# Patient Record
Sex: Female | Born: 1990 | Race: White | Hispanic: Yes | Marital: Married | Smoking: Never smoker
Health system: Southern US, Community
[De-identification: ages and names within clinical notes are randomized; demographics above are authoritative.]

## PROBLEM LIST (undated history)

## (undated) DIAGNOSIS — Z5189 Encounter for other specified aftercare: Secondary | ICD-10-CM

## (undated) DIAGNOSIS — Z789 Other specified health status: Secondary | ICD-10-CM

---

## 2020-01-07 ENCOUNTER — Ambulatory Visit: Payer: Medicaid Other | Attending: Internal Medicine

## 2020-01-07 DIAGNOSIS — Z23 Encounter for immunization: Secondary | ICD-10-CM

## 2020-01-07 NOTE — Progress Notes (Signed)
   Covid-19 Vaccination Clinic  Name:  Tenleigh Byer    MRN: [id number] DOB: 1990/11/21  01/07/2020  Ms. Liska was observed post Covid-19 immunization for 15 minutes without incident. She was provided with Vaccine Information Sheet and instruction to access the V-Safe system.   Ms. Buchan was instructed to call 911 with any severe reactions post vaccine: Marland Kitchen Difficulty breathing  . Swelling of face and throat  . A fast heartbeat  . A bad rash all over body  . Dizziness and weakness   Immunizations Administered    Name Date Dose VIS Date Route   Moderna COVID-19 Vaccine 01/07/2020  4:30 PM 0.5 mL 04/2019 Intramuscular   Manufacturer: Gala Murdoch   Lot: [id number]   NDC: 58527-782-42

## 2020-02-04 ENCOUNTER — Ambulatory Visit: Payer: Medicaid Other

## 2020-02-05 ENCOUNTER — Ambulatory Visit: Payer: Medicaid Other | Attending: Internal Medicine

## 2020-02-05 DIAGNOSIS — Z23 Encounter for immunization: Secondary | ICD-10-CM

## 2020-02-05 NOTE — Progress Notes (Signed)
   Covid-19 Vaccination Clinic  Name:  Amy Fleming    MRN: [id number] DOB: May 13, 1991  02/05/2020  Amy Fleming was observed post Covid-19 immunization for 30 minutes based on pre-vaccination screening without incident. She was provided with Vaccine Information Sheet and instruction to access the V-Safe system.   Amy Fleming was instructed to call 911 with any severe reactions post vaccine: Marland Kitchen Difficulty breathing  . Swelling of face and throat  . A fast heartbeat  . A bad rash all over body  . Dizziness and weakness   Immunizations Administered    Name Date Dose VIS Date Route   Moderna COVID-19 Vaccine 02/05/2020  2:37 PM 0.5 mL 04/2019 Intramuscular   Manufacturer: Moderna   Lot: [id number]   NDC: 41324-401-02

## 2021-07-14 ENCOUNTER — Encounter: Payer: Self-pay | Admitting: Emergency Medicine

## 2021-07-14 ENCOUNTER — Other Ambulatory Visit: Payer: Self-pay

## 2021-07-14 ENCOUNTER — Emergency Department: Payer: Medicaid Other

## 2021-07-14 ENCOUNTER — Emergency Department
Admission: EM | Admit: 2021-07-14 | Discharge: 2021-07-14 | Disposition: A | Discharge status: Home / Self Care | Payer: Medicaid Other | Attending: Emergency Medicine | Admitting: Emergency Medicine

## 2021-07-14 DIAGNOSIS — O209 Hemorrhage in early pregnancy, unspecified: Secondary | ICD-10-CM | POA: Diagnosis not present

## 2021-07-14 DIAGNOSIS — Z3A01 Less than 8 weeks gestation of pregnancy: Secondary | ICD-10-CM

## 2021-07-14 DIAGNOSIS — D72829 Elevated white blood cell count, unspecified: Secondary | ICD-10-CM | POA: Diagnosis not present

## 2021-07-14 DIAGNOSIS — R109 Unspecified abdominal pain: Secondary | ICD-10-CM

## 2021-07-14 DIAGNOSIS — O1490 Unspecified pre-eclampsia, unspecified trimester: Secondary | ICD-10-CM | POA: Diagnosis not present

## 2021-07-14 DIAGNOSIS — O26891 Other specified pregnancy related conditions, first trimester: Secondary | ICD-10-CM | POA: Diagnosis not present

## 2021-07-14 DIAGNOSIS — O99111 Other diseases of the blood and blood-forming organs and certain disorders involving the immune mechanism complicating pregnancy, first trimester: Secondary | ICD-10-CM | POA: Insufficient documentation

## 2021-07-14 DIAGNOSIS — R824 Acetonuria: Secondary | ICD-10-CM | POA: Diagnosis not present

## 2021-07-14 DIAGNOSIS — R1030 Lower abdominal pain, unspecified: Secondary | ICD-10-CM

## 2021-07-14 LAB — CBC WITH DIFFERENTIAL/PLATELET
Abs Immature Granulocytes: 0.06 10*3/uL (ref 0.00–0.07)
Basophils Absolute: 0 10*3/uL (ref 0.0–0.1)
Basophils Relative: 0 %
Eosinophils Absolute: 0.4 10*3/uL (ref 0.0–0.5)
Eosinophils Relative: 3 %
HCT: 39.4 % (ref 36.0–46.0)
Hemoglobin: 13.1 g/dL (ref 12.0–15.0)
Immature Granulocytes: 0 %
Lymphocytes Relative: 27 %
Lymphs Abs: 3.8 10*3/uL (ref 0.7–4.0)
MCH: 31.8 pg (ref 26.0–34.0)
MCHC: 33.2 g/dL (ref 30.0–36.0)
MCV: 95.6 fL (ref 80.0–100.0)
Monocytes Absolute: 0.7 10*3/uL (ref 0.1–1.0)
Monocytes Relative: 5 %
Neutro Abs: 9.2 10*3/uL — ABNORMAL HIGH (ref 1.7–7.7)
Neutrophils Relative %: 65 %
Platelets: 243 10*3/uL (ref 150–400)
RBC: 4.12 MIL/uL (ref 3.87–5.11)
RDW: 13.2 % (ref 11.5–15.5)
WBC: 14.2 10*3/uL — ABNORMAL HIGH (ref 4.0–10.5)
nRBC: 0 % (ref 0.0–0.2)

## 2021-07-14 LAB — URINALYSIS, ROUTINE W REFLEX MICROSCOPIC
Bilirubin Urine: NEGATIVE
Glucose, UA: NEGATIVE mg/dL
Ketones, ur: 5 mg/dL — AB
Leukocytes,Ua: NEGATIVE
Nitrite: NEGATIVE
Protein, ur: NEGATIVE mg/dL
Specific Gravity, Urine: 1.018 (ref 1.005–1.030)
pH: 5 (ref 5.0–8.0)

## 2021-07-14 LAB — WET PREP, GENITAL
Clue Cells Wet Prep HPF POC: NONE SEEN
Sperm: NONE SEEN
Trich, Wet Prep: NONE SEEN
WBC, Wet Prep HPF POC: >=10 — AB (ref <10)
Yeast Wet Prep HPF POC: NONE SEEN

## 2021-07-14 LAB — CHLAMYDIA/NGC RT PCR (ARMC ONLY)
Chlamydia Tr: NOT DETECTED
N gonorrhoeae: NOT DETECTED

## 2021-07-14 LAB — COMPREHENSIVE METABOLIC PANEL
ALT: 16 U/L (ref 0–44)
AST: 16 U/L (ref 15–41)
Albumin: 4.2 g/dL (ref 3.5–5.0)
Alkaline Phosphatase: 82 U/L (ref 38–126)
Anion gap: 7 (ref 5–15)
BUN: 10 mg/dL (ref 6–20)
CO2: 22 mmol/L (ref 22–32)
Calcium: 9.1 mg/dL (ref 8.9–10.3)
Chloride: 106 mmol/L (ref 98–111)
Creatinine, Ser: 0.5 mg/dL (ref 0.44–1.00)
GFR, Estimated: >60 mL/min (ref >60)
Glucose, Bld: 105 mg/dL — ABNORMAL HIGH (ref 70–99)
Potassium: 3.6 mmol/L (ref 3.5–5.1)
Sodium: 135 mmol/L (ref 135–145)
Total Bilirubin: 0.7 mg/dL (ref 0.3–1.2)
Total Protein: 7.4 g/dL (ref 6.5–8.1)

## 2021-07-14 LAB — POC URINE PREG, ED: Preg Test, Ur: POSITIVE — AB

## 2021-07-14 LAB — HCG, QUANTITATIVE, PREGNANCY: hCG, Beta Chain, Quant, S: 39793 m[IU]/mL — ABNORMAL HIGH (ref <5)

## 2021-07-14 LAB — ABO/RH: ABO/RH(D): O POS

## 2021-07-14 NOTE — ED Notes (Signed)
Pt denies bleeding, cramping at this time. Does report small brown smear on panty liner. NAD

## 2021-07-14 NOTE — ED Notes (Signed)
Pt in US

## 2021-07-14 NOTE — ED Provider Notes (Signed)
----------------------------------------- °  9:21 AM on 07/14/2021 -----------------------------------------  Patient's ultrasound has resulted showing intrauterine pregnancy around 6 weeks.  Patient states this correlates well with her last menstrual period.  The remainder the patient's work-up has shown no concerning findings.  Patient's gonorrhea/chlamydia is negative.  Beta-hCG is resulted at 39,000.  Patient is O+ blood type.  Given the patient's reassuring work-up I discussed routine OB/GYN follow-up.  Also provided return precautions.   Minna Antis, MD 07/14/21 [phone]

## 2021-07-14 NOTE — ED Triage Notes (Signed)
Patient ambulatory to triage with steady gait, without difficulty or distress noted; pt reports approx [redacted]wks pregnant with abd cramping last few days, now with brown vag discharge; home pregnancy test ; G3P2

## 2021-07-14 NOTE — Discharge Instructions (Signed)
Please follow-up with an OB/GYN over the next several weeks for recheck/reevaluation.  Return to the emergency department for any symptoms personally concerning to yourself such as vaginal bleeding, fluid leakage or pain.

## 2021-07-14 NOTE — ED Provider Notes (Signed)
[facility] Provider Note    Event Date/Time   First MD Initiated Contact with Patient 07/14/21 0530     (approximate)   History   Abdominal Pain   HPI  Amy Fleming is a 31 y.o. female who presents to the ED for evaluation of Abdominal Pain   G3, P2 at about [redacted] weeks gestation.  Patient presents to the ED for evaluation of 2-3 days of lower abdominal cramping and vaginal spotting.  She reports dark brown discharge that has been new for her, she reports low-volume that is only spotting without clots or bright red blood.  Denies fever, emesis, diarrhea or stool changes.  Denies dysuria or hematuria. Reports a close family who recently had a miscarriage and she is concerned about this   Physical Exam   Triage Vital Signs: ED Triage Vitals  Enc Vitals Group     BP 07/14/21 0349 (!) 164/100     Pulse Rate 07/14/21 0349 99     Resp 07/14/21 0349 18     Temp 07/14/21 0349 98.5 F (36.9 C)     Temp Source 07/14/21 0349 Oral     SpO2 07/14/21 0349 99 %     Weight 07/14/21 0349 120 lb (54.4 kg)     Height 07/14/21 0349 5' (1.524 m)     Head Circumference --      Peak Flow --      Pain Score 07/14/21 0348 4     Pain Loc --      Pain Edu? --      Excl. in [place]? --     Most recent vital signs: Vitals:   07/14/21 0349 07/14/21 0630  BP: (!) 164/100 (!) 148/91  Pulse: 99 91  Resp: 18 16  Temp: 98.5 F (36.9 C)   SpO2: 99% 100%    General: Awake, no distress.  CV:  Good peripheral perfusion.  Resp:  Normal effort.  Abd:  No distention.  Minimal lower abdominal tenderness diffusely without peritoneal features or guarding. MSK:  No deformity noted.  Neuro:  No focal deficits appreciated. Other:  Pelvic exam with no bleeding.  Closed cervical os.  Small-volume thick white discharge.   ED Results / Procedures / Treatments   Labs (all labs ordered are listed, but only abnormal results are displayed) Labs Reviewed  CBC WITH  DIFFERENTIAL/PLATELET - Abnormal; Notable for the following components:      Result Value   WBC 14.2 (*)    Neutro Abs 9.2 (*)    All other components within normal limits  COMPREHENSIVE METABOLIC PANEL - Abnormal; Notable for the following components:   Glucose, Bld 105 (*)    All other components within normal limits  HCG, QUANTITATIVE, PREGNANCY - Abnormal; Notable for the following components:   hCG, Beta Chain, Quant, S 39,793 (*)    All other components within normal limits  URINALYSIS, ROUTINE W REFLEX MICROSCOPIC - Abnormal; Notable for the following components:   Color, Urine YELLOW (*)    APPearance HAZY (*)    Hgb urine dipstick MODERATE (*)    Ketones, ur 5 (*)    Bacteria, UA RARE (*)    All other components within normal limits  POC URINE PREG, ED - Abnormal; Notable for the following components:   Preg Test, Ur POSITIVE (*)    All other components within normal limits  WET PREP, GENITAL  CHLAMYDIA/NGC RT PCR ([facility] ONLY)  ABO/RH    EKG   RADIOLOGY   Official radiology report(s): No results found.  PROCEDURES and INTERVENTIONS:  Procedures  Medications - No data to display   IMPRESSION / MDM / [place] / ED COURSE  I reviewed the triage vital signs and the nursing notes.  31 year old female G3 P2 at [redacted] weeks gestation presents to the ED with mild lower abdominal cramping and brown vaginal spotting.  She looks well to me overall and has reassuring vital signs.  Slightly hypertensive, but too early to diagnose preeclampsia.  Blood work with normal metabolic panel and CBC with mild leukocytosis.  Urine with some ketonuria suggestive of dehydration, but no infectious features.  hCG is appropriately elevated considering her gestational age.  Wet prep and GC are pending to evaluate for infectious etiology or BV to cause her increased discharge.  Pelvic ultrasound is pending to evaluate IUP or complications such as ectopic pregnancy or  subchorionic hemorrhage.  Patient signed out to oncoming provider to follow-up on these studies.  Clinical Course as of 07/14/21 0653  Tue Jul 14, 2021  0637 Pelvic performed. Well tolerated. [DS]    Clinical Course User Index [DS] Vladimir Crofts, MD     FINAL CLINICAL IMPRESSION(S) / ED DIAGNOSES   Final diagnoses:  Abdominal cramping  Less than [redacted] weeks gestation of pregnancy  Lower abdominal pain     Rx / DC Orders   ED Discharge Orders     None        Note:  This document was prepared using Dragon voice recognition software and may include unintentional dictation errors.   Vladimir Crofts, MD 07/14/21 [phone]

## 2021-08-10 ENCOUNTER — Emergency Department
Admission: EM | Admit: 2021-08-10 | Discharge: 2021-08-10 | Disposition: A | Discharge status: Home / Self Care | Payer: Medicaid Other | Attending: Emergency Medicine | Admitting: Emergency Medicine

## 2021-08-10 ENCOUNTER — Emergency Department: Payer: Medicaid Other

## 2021-08-10 ENCOUNTER — Other Ambulatory Visit: Payer: Self-pay

## 2021-08-10 ENCOUNTER — Encounter: Payer: Self-pay | Admitting: Emergency Medicine

## 2021-08-10 DIAGNOSIS — N9489 Other specified conditions associated with female genital organs and menstrual cycle: Secondary | ICD-10-CM | POA: Diagnosis not present

## 2021-08-10 DIAGNOSIS — O4690 Antepartum hemorrhage, unspecified, unspecified trimester: Secondary | ICD-10-CM

## 2021-08-10 DIAGNOSIS — Z3A09 9 weeks gestation of pregnancy: Secondary | ICD-10-CM | POA: Diagnosis not present

## 2021-08-10 DIAGNOSIS — O209 Hemorrhage in early pregnancy, unspecified: Secondary | ICD-10-CM | POA: Insufficient documentation

## 2021-08-10 DIAGNOSIS — M545 Low back pain, unspecified: Secondary | ICD-10-CM | POA: Insufficient documentation

## 2021-08-10 LAB — CBC WITH DIFFERENTIAL/PLATELET
Abs Immature Granulocytes: 0.08 10*3/uL — ABNORMAL HIGH (ref 0.00–0.07)
Basophils Absolute: 0 10*3/uL (ref 0.0–0.1)
Basophils Relative: 0 %
Eosinophils Absolute: 0.1 10*3/uL (ref 0.0–0.5)
Eosinophils Relative: 1 %
HCT: 41.6 % (ref 36.0–46.0)
Hemoglobin: 14 g/dL (ref 12.0–15.0)
Immature Granulocytes: 0 %
Lymphocytes Relative: 12 %
Lymphs Abs: 2.1 10*3/uL (ref 0.7–4.0)
MCH: 32 pg (ref 26.0–34.0)
MCHC: 33.7 g/dL (ref 30.0–36.0)
MCV: 95.2 fL (ref 80.0–100.0)
Monocytes Absolute: 0.6 10*3/uL (ref 0.1–1.0)
Monocytes Relative: 3 %
Neutro Abs: 15 10*3/uL — ABNORMAL HIGH (ref 1.7–7.7)
Neutrophils Relative %: 84 %
Platelets: 256 10*3/uL (ref 150–400)
RBC: 4.37 MIL/uL (ref 3.87–5.11)
RDW: 12.9 % (ref 11.5–15.5)
WBC: 18 10*3/uL — ABNORMAL HIGH (ref 4.0–10.5)
nRBC: 0 % (ref 0.0–0.2)

## 2021-08-10 LAB — URINALYSIS, COMPLETE (UACMP) WITH MICROSCOPIC
Bilirubin Urine: NEGATIVE
Glucose, UA: NEGATIVE mg/dL
Ketones, ur: 20 mg/dL — AB
Leukocytes,Ua: NEGATIVE
Nitrite: NEGATIVE
Protein, ur: NEGATIVE mg/dL
Specific Gravity, Urine: 1.02 (ref 1.005–1.030)
pH: 5 (ref 5.0–8.0)

## 2021-08-10 LAB — HCG, QUANTITATIVE, PREGNANCY: hCG, Beta Chain, Quant, S: 249792 m[IU]/mL — ABNORMAL HIGH (ref <5)

## 2021-08-10 LAB — BASIC METABOLIC PANEL
Anion gap: 8 (ref 5–15)
BUN: 10 mg/dL (ref 6–20)
CO2: 23 mmol/L (ref 22–32)
Calcium: 9.3 mg/dL (ref 8.9–10.3)
Chloride: 104 mmol/L (ref 98–111)
Creatinine, Ser: 0.5 mg/dL (ref 0.44–1.00)
GFR, Estimated: >60 mL/min (ref >60)
Glucose, Bld: 91 mg/dL (ref 70–99)
Potassium: 3.8 mmol/L (ref 3.5–5.1)
Sodium: 135 mmol/L (ref 135–145)

## 2021-08-10 MED ORDER — CEPHALEXIN 500 MG PO CAPS: 500.0000 mg | ORAL_CAPSULE | Freq: Four times a day (QID) | ORAL | 0 refills | Status: AC

## 2021-08-10 NOTE — ED Triage Notes (Signed)
C/O vaginal bleeding today.  States she is [redacted] weeks pregnant, G3P2.  Past history of Gestational diabetes, would like sugar checked.   ? ?AAOx3.  Anxious.  Cooperative. NAD ?

## 2021-08-10 NOTE — Discharge Instructions (Signed)
Please take Keflex 4 times daily for the next 7 days. ?Return with increasing vaginal bleeding or pelvic pain. ?

## 2021-08-10 NOTE — ED Provider Notes (Signed)
? ?[facility] ?Provider Note ? ?Patient Contact: 6:54 PM (approximate) ? ? ?History  ? ?Vaginal Bleeding ? ? ?HPI ? ?Aiesha Aurellia Vitrano is a 31 y.o. female G3, P2 currently approximately [redacted] weeks pregnant presents to the emergency department with trace amount of vaginal bleeding.  Patient states that she has had some mild low back pain and cramping but no abdominal pain.  Patient states that she did have intercourse 2 days prior to onset of vaginal bleeding which she has experienced in the past with other pregnancies.  She denies fever, chills or dysuria. ? ?  ? ? ?Physical Exam  ? ?Triage Vital Signs: ?ED Triage Vitals  ?Enc Vitals Group  ?   BP 08/10/21 1816 (!) 142/85  ?   Pulse Rate 08/10/21 1816 74  ?   Resp 08/10/21 1816 14  ?   Temp 08/10/21 1816 98.5 ?F (36.9 ?C)  ?   Temp Source 08/10/21 1816 Oral  ?   SpO2 08/10/21 1816 100 %  ?   Weight 08/10/21 1742 120 lb 2.4 oz (54.5 kg)  ?   Height 08/10/21 1742 5' (1.524 m)  ?   Head Circumference --   ?   Peak Flow --   ?   Pain Score 08/10/21 1742 0  ?   Pain Loc --   ?   Pain Edu? --   ?   Excl. in [place]? --   ? ? ?Most recent vital signs: ?Vitals:  ? 08/10/21 1816  ?BP: (!) 142/85  ?Pulse: 74  ?Resp: 14  ?Temp: 98.5 ?F (36.9 ?C)  ?SpO2: 100%  ? ? ? ?General: Alert and in no acute distress. ?Eyes:  PERRL. EOMI. ?Head: No acute traumatic findings ?ENT: ?     Nose: No congestion/rhinnorhea. ?     Mouth/Throat: Mucous membranes are moist. ?Neck: No stridor. No cervical spine tenderness to palpation. ?Cardiovascular:  Good peripheral perfusion ?Respiratory: Normal respiratory effort without tachypnea or retractions. Lungs CTAB. Good air entry to the bases with no decreased or absent breath sounds. ?Gastrointestinal: Bowel sounds ?4 quadrants. Soft and nontender to palpation. No guarding or rigidity. No palpable masses. No distention. No CVA tenderness. ?Musculoskeletal: Full range of motion to all extremities.  ?Neurologic:  No gross focal  neurologic deficits are appreciated.  ?Skin:   No rash noted ?Other: ? ? ?ED Results / Procedures / Treatments  ? ?Labs ?(all labs ordered are listed, but only abnormal results are displayed) ?Labs Reviewed  ?CBC WITH DIFFERENTIAL/PLATELET - Abnormal; Notable for the following components:  ?    Result Value  ? WBC 18.0 (*)   ? Neutro Abs 15.0 (*)   ? Abs Immature Granulocytes 0.08 (*)   ? All other components within normal limits  ?HCG, QUANTITATIVE, PREGNANCY - Abnormal; Notable for the following components:  ? hCG, Beta Neomia Dear 249,792 (*)   ? All other components within normal limits  ?URINALYSIS, COMPLETE (UACMP) WITH MICROSCOPIC - Abnormal; Notable for the following components:  ? Color, Urine YELLOW (*)   ? APPearance HAZY (*)   ? Hgb urine dipstick LARGE (*)   ? Ketones, ur 20 (*)   ? Bacteria, UA RARE (*)   ? All other components within normal limits  ?BASIC METABOLIC PANEL  ? ? ? ? ?RADIOLOGY ? ?I personally viewed and evaluated these images as part of my medical decision making, as well as reviewing the written report by the radiologist. ? ?ED Provider Interpretation:  ? ? ?  PROCEDURES: ? ?Critical Care performed: No ? ?Procedures ? ? ?MEDICATIONS ORDERED IN ED: ?Medications - No data to display ? ? ?IMPRESSION / MDM / ASSESSMENT AND PLAN / ED COURSE  ?I reviewed the triage vital signs and the nursing notes. ?             ?               ? ?Differential diagnosis includes, but is not limited to, vaginal bleeding in pregnancy, threatened pregnancy, subchorionic hematoma, first trimester vaginal bleeding... ? ?Assessment and plan ?Vaginal bleeding in pregnancy ?31 year old female presents to the emergency department with trace vaginal bleeding that she noticed today. ? ?Vital signs are reassuring at triage.  On physical exam, patient was alert, active and nontoxic-appearing.  Beta-hCG was appropriately elevated ? ?OB ultrasound shows a single viable intrauterine pregnancy at 166 bpm.  Patient did have  rare bacteria identified on her urinalysis and she was treated for asymptomatic bacteria in pregnancy.  She was advised to follow-up with her OB/GYN as directed. ? ?Clinical Course as of 08/10/21 2201  ?Mon Aug 10, 2021  ?1852 Chloride: 104 [JW]  ?  ?Clinical Course User Index ?[JW] Vallarie Mare M, PA-C  ? ? ? ?FINAL CLINICAL IMPRESSION(S) / ED DIAGNOSES  ? ?Final diagnoses:  ?Vaginal bleeding in pregnancy  ? ? ? ?Rx / DC Orders  ? ?ED Discharge Orders   ? ?      Ordered  ?  cephALEXin (KEFLEX) 500 MG capsule  4 times daily       ? 08/10/21 2136  ? ?  ?  ? ?  ? ? ? ?Note:  This document was prepared using Dragon voice recognition software and may include unintentional dictation errors. ?  ?Lannie Fields, PA-C ?08/10/21 2201 ? ?  ?Blake Divine, MD ?08/13/21 1044 ? ?

## 2021-08-26 ENCOUNTER — Other Ambulatory Visit: Payer: Self-pay | Admitting: Obstetrics

## 2021-08-26 ENCOUNTER — Ambulatory Visit
Admission: RE | Admit: 2021-08-26 | Discharge: 2021-08-26 | Disposition: A | Discharge status: Home / Self Care | Payer: Medicaid Other | Source: Ambulatory Visit | Attending: Obstetrics | Admitting: Obstetrics

## 2021-08-26 DIAGNOSIS — O3680X0 Pregnancy with inconclusive fetal viability, not applicable or unspecified: Secondary | ICD-10-CM | POA: Diagnosis present

## 2021-09-09 MED ORDER — DOXYCYCLINE HYCLATE 100 MG IV SOLR
200.0000 mg | INTRAVENOUS | Status: AC
  Administered 2021-09-10: 200 mg via INTRAVENOUS
  Filled 2021-09-09: qty 200

## 2021-09-09 MED ORDER — CHLORHEXIDINE GLUCONATE 0.12 % MT SOLN: 15.0000 mL | Freq: Once | OROMUCOSAL | Status: AC

## 2021-09-09 MED ORDER — LACTATED RINGERS IV SOLN: INTRAVENOUS | Status: DC

## 2021-09-09 MED ORDER — ORAL CARE MOUTH RINSE: 15.0000 mL | Freq: Once | OROMUCOSAL | Status: AC

## 2021-09-10 ENCOUNTER — Ambulatory Visit
Admission: RE | Admit: 2021-09-10 | Discharge: 2021-09-10 | Disposition: A | Discharge status: Home / Self Care | Payer: Medicaid Other | Attending: Obstetrics and Gynecology | Admitting: Obstetrics and Gynecology

## 2021-09-10 ENCOUNTER — Ambulatory Visit: Payer: Medicaid Other | Admitting: Anesthesiology

## 2021-09-10 ENCOUNTER — Encounter: Payer: Self-pay | Admitting: Obstetrics and Gynecology

## 2021-09-10 ENCOUNTER — Other Ambulatory Visit: Payer: Self-pay

## 2021-09-10 ENCOUNTER — Encounter
Admission: RE | Disposition: A | Discharge status: Home / Self Care | Payer: Self-pay | Source: Home / Self Care | Attending: Obstetrics and Gynecology

## 2021-09-10 DIAGNOSIS — O021 Missed abortion: Secondary | ICD-10-CM | POA: Diagnosis present

## 2021-09-10 HISTORY — PX: DILATION AND EVACUATION: SHX1459

## 2021-09-10 HISTORY — DX: Other specified health status: Z78.9

## 2021-09-10 LAB — TYPE AND SCREEN
ABO/RH(D): O POS
Antibody Screen: NEGATIVE

## 2021-09-10 LAB — CBC
HCT: 41.1 % (ref 36.0–46.0)
Hemoglobin: 13.9 g/dL (ref 12.0–15.0)
MCH: 32.2 pg (ref 26.0–34.0)
MCHC: 33.8 g/dL (ref 30.0–36.0)
MCV: 95.1 fL (ref 80.0–100.0)
Platelets: 214 10*3/uL (ref 150–400)
RBC: 4.32 MIL/uL (ref 3.87–5.11)
RDW: 12.6 % (ref 11.5–15.5)
WBC: 8.8 10*3/uL (ref 4.0–10.5)
nRBC: 0 % (ref 0.0–0.2)

## 2021-09-10 SURGERY — DILATION AND EVACUATION, UTERUS
Anesthesia: General | Site: Uterus

## 2021-09-10 MED ORDER — CHLORHEXIDINE GLUCONATE 0.12 % MT SOLN
OROMUCOSAL | Status: AC
  Administered 2021-09-10: 15 mL via OROMUCOSAL
  Filled 2021-09-10: qty 15

## 2021-09-10 MED ORDER — MIDAZOLAM HCL 2 MG/2ML IJ SOLN
INTRAMUSCULAR | Status: AC
  Filled 2021-09-10: qty 2

## 2021-09-10 MED ORDER — OXYCODONE HCL 5 MG PO TABS: 5.0000 mg | ORAL_TABLET | Freq: Once | ORAL | Status: DC | PRN

## 2021-09-10 MED ORDER — PROPOFOL 10 MG/ML IV BOLUS
INTRAVENOUS | Status: DC | PRN
  Administered 2021-09-10: 200 mg via INTRAVENOUS

## 2021-09-10 MED ORDER — FENTANYL CITRATE (PF) 100 MCG/2ML IJ SOLN
INTRAMUSCULAR | Status: DC | PRN
  Administered 2021-09-10: 50 ug via INTRAVENOUS

## 2021-09-10 MED ORDER — DEXAMETHASONE SODIUM PHOSPHATE 10 MG/ML IJ SOLN
INTRAMUSCULAR | Status: DC | PRN
  Administered 2021-09-10: 10 mg via INTRAVENOUS

## 2021-09-10 MED ORDER — LIDOCAINE HCL (CARDIAC) PF 100 MG/5ML IV SOSY
PREFILLED_SYRINGE | INTRAVENOUS | Status: DC | PRN
  Administered 2021-09-10: 70 mg via INTRAVENOUS

## 2021-09-10 MED ORDER — SILVER NITRATE-POT NITRATE 75-25 % EX MISC
CUTANEOUS | Status: AC
  Filled 2021-09-10: qty 10

## 2021-09-10 MED ORDER — FENTANYL CITRATE (PF) 100 MCG/2ML IJ SOLN: 25.0000 ug | INTRAMUSCULAR | Status: DC | PRN

## 2021-09-10 MED ORDER — SILVER NITRATE-POT NITRATE 75-25 % EX MISC
CUTANEOUS | Status: DC | PRN
  Administered 2021-09-10: 2

## 2021-09-10 MED ORDER — MIDAZOLAM HCL 2 MG/2ML IJ SOLN
INTRAMUSCULAR | Status: DC | PRN
  Administered 2021-09-10: 2 mg via INTRAVENOUS

## 2021-09-10 MED ORDER — ONDANSETRON HCL 4 MG/2ML IJ SOLN
INTRAMUSCULAR | Status: DC | PRN
  Administered 2021-09-10: 4 mg via INTRAVENOUS

## 2021-09-10 MED ORDER — OXYCODONE HCL 5 MG/5ML PO SOLN: 5.0000 mg | Freq: Once | ORAL | Status: DC | PRN

## 2021-09-10 MED ORDER — IBUPROFEN 600 MG PO TABS: 600.0000 mg | ORAL_TABLET | Freq: Four times a day (QID) | ORAL | 0 refills | Status: AC

## 2021-09-10 MED ORDER — PROPOFOL 10 MG/ML IV BOLUS
INTRAVENOUS | Status: AC
  Filled 2021-09-10: qty 20

## 2021-09-10 MED ORDER — FENTANYL CITRATE (PF) 100 MCG/2ML IJ SOLN
INTRAMUSCULAR | Status: AC
  Filled 2021-09-10: qty 2

## 2021-09-10 SURGICAL SUPPLY — 29 items
BACTOSHIELD CHG 4% 4OZ (MISCELLANEOUS) ×1
BAG DRN RND TRDRP ANRFLXCHMBR (UROLOGICAL SUPPLIES)
BAG URINE DRAIN 2000ML AR STRL (UROLOGICAL SUPPLIES) IMPLANT
CATH FOLEY 2WAY  5CC 16FR (CATHETERS)
CATH FOLEY 2WAY 5CC 16FR (CATHETERS)
CATH URTH 16FR FL 2W BLN LF (CATHETERS) IMPLANT
DRSG TELFA 3X8 NADH (GAUZE/BANDAGES/DRESSINGS) IMPLANT
FILTER UTR ASPR SPEC (MISCELLANEOUS) ×1 IMPLANT
FLTR UTR ASPR SPEC (MISCELLANEOUS) ×2
GLOVE BIO SURGEON STRL SZ7 (GLOVE) ×2 IMPLANT
GOWN STRL REUS W/ TWL LRG LVL3 (GOWN DISPOSABLE) ×2 IMPLANT
GOWN STRL REUS W/TWL LRG LVL3 (GOWN DISPOSABLE) ×4
KIT BERKELEY 1ST TRIMESTER 3/8 (MISCELLANEOUS) ×2 IMPLANT
KIT TURNOVER CYSTO (KITS) ×2 IMPLANT
MANIFOLD NEPTUNE II (INSTRUMENTS) ×2 IMPLANT
Med Gyn Disposable Flexible Curette ×1 IMPLANT
NS IRRIG 500ML POUR BTL (IV SOLUTION) ×2 IMPLANT
PACK DNC HYST (MISCELLANEOUS) ×2 IMPLANT
PAD DRESSING TELFA 3X8 NADH (GAUZE/BANDAGES/DRESSINGS) IMPLANT
PAD OB MATERNITY 4.3X12.25 (PERSONAL CARE ITEMS) ×1 IMPLANT
PAD PREP 24X41 OB/GYN DISP (PERSONAL CARE ITEMS) ×2 IMPLANT
SCRUB CHG 4% DYNA-HEX 4OZ (MISCELLANEOUS) ×1 IMPLANT
SET BERKELEY SUCTION TUBING (SUCTIONS) ×2 IMPLANT
SET CYSTO W/LG BORE CLAMP LF (SET/KITS/TRAYS/PACK) IMPLANT
TOWEL OR 17X26 4PK STRL BLUE (TOWEL DISPOSABLE) ×2 IMPLANT
VACURETTE 10 RIGID CVD (CANNULA) ×1 IMPLANT
VACURETTE 12 RIGID CVD (CANNULA) IMPLANT
VACURETTE 8 RIGID CVD (CANNULA) IMPLANT
WATER STERILE IRR 500ML POUR (IV SOLUTION) ×2 IMPLANT

## 2021-09-10 NOTE — Anesthesia Preprocedure Evaluation (Signed)
Anesthesia Evaluation  ?Patient identified by MRN, date of birth, ID band ?Patient awake ? ? ? ?Reviewed: ?Allergy & Precautions, NPO status , Patient's Chart, lab work & pertinent test results ? ?History of Anesthesia Complications ?Negative for: history of anesthetic complications ? ?Airway ?Mallampati: II ? ?TM Distance: >3 FB ?Neck ROM: full ? ? ? Dental ? ?(+) Chipped ?  ?Pulmonary ?neg pulmonary ROS, neg shortness of breath,  ?  ?Pulmonary exam normal ? ? ? ? ? ? ? Cardiovascular ?Exercise Tolerance: Good ?(-) angina(-) Past MI and (-) DOE negative cardio ROS ?Normal cardiovascular exam ? ? ?  ?Neuro/Psych ?negative neurological ROS ? negative psych ROS  ? GI/Hepatic ?negative GI ROS, Neg liver ROS, neg GERD  ,  ?Endo/Other  ?negative endocrine ROS ? Renal/GU ?  ? ?  ?Musculoskeletal ? ? Abdominal ?  ?Peds ? Hematology ?negative hematology ROS ?(+)   ?Anesthesia Other Findings ?History reviewed. No pertinent past medical history. ? ?History reviewed. No pertinent surgical history. ? ?BMI   ? Body Mass Index: 23.05 kg/m?  ?  ? ? Reproductive/Obstetrics ?negative OB ROS ? ?  ? ? ? ? ? ? ? ? ? ? ? ? ? ?  ?  ? ? ? ? ? ? ? ? ?Anesthesia Physical ?Anesthesia Plan ? ?ASA: 1 ? ?Anesthesia Plan: General LMA  ? ?Post-op Pain Management:   ? ?Induction: Intravenous ? ?PONV Risk Score and Plan: Dexamethasone, Ondansetron, Midazolam and Treatment may vary due to age or medical condition ? ?Airway Management Planned: LMA ? ?Additional Equipment:  ? ?Intra-op Plan:  ? ?Post-operative Plan: Extubation in OR ? ?Informed Consent: I have reviewed the patients History and Physical, chart, labs and discussed the procedure including the risks, benefits and alternatives for the proposed anesthesia with the patient or authorized representative who has indicated his/her understanding and acceptance.  ? ? ? ?Dental Advisory Given ? ?Plan Discussed with: Anesthesiologist, CRNA and Surgeon ? ?Anesthesia  Plan Comments: (Patient consented for risks of anesthesia including but not limited to:  ?- adverse reactions to medications ?- damage to eyes, teeth, lips or other oral mucosa ?- nerve damage due to positioning  ?- sore throat or hoarseness ?- Damage to heart, brain, nerves, lungs, other parts of body or loss of life ? ?Patient voiced understanding.)  ? ? ? ? ? ? ?Anesthesia Quick Evaluation ? ?

## 2021-09-10 NOTE — Op Note (Signed)
?  Operative Note   ? ?Name: Amy Fleming  ?Date of Service: 09/10/2021  ?DOB: [birth year]  ?MRN: [id number]  ? ?Pre-Operative Diagnosis: Missed abortion [O02.1] ? ?Post-Operative Diagnosis: Missed abortion [O02.1] ? ?Procedures: Suction, dilation and curettage ? ?Primary Surgeon: Thomasene Mohair, MD ?  ?EBL: 500 mL  ? ?IVF: 300 mL  ? ?Urine output: 30 mL ? ?Specimens: Products of conception ? ?Drains: none ? ?Complications: None  ? ?Disposition: PACU  ? ?Condition: Stable  ? ?Findings: enlarged uterus, anteverted.  Products of conception returned on suction.  ? ?Procedure Summary:  ?After informed consent was confirmed, the patient was taken to the operating room where general anesthesia was induced.  Her legs were carefully placed in the candy cane stirrups.  She was prepped and draped in the standard fashion.  A speculum was placed in the vagina and a tenaculum was placed on the anterior lip of the cervix.  The cervix was serially dilated to 11 mm.  The 10 mm rigid suction curette was advanced to the uterine fundus.  Three passes were made with the suction curette with evacuation of adequate tissue noted.  Two passes were made using sharp curettage until a gritty texture was noted.  Another pass was made with the 8 mm flexible curette.  One final pass was made with both the sharp (non-serrated) and suction curette.  Hemostasis was noted from the cervix.  All instruments were removed from the vagina and the cervix was noted to be hemostatic after removal of the tenaculum with the use of silver nitrate.   ? ?The patient tolerated the procedure well.  Sponge, lap, needle, and instrument counts were correct x 2.  VTE prophylaxis: SCDs. Antibiotic prophylaxis: doxycycline 200 mg IV prior to the surgery. She was awakened in the operating room and was taken to the PACU in stable condition.  ? ?Thomasene Mohair, MD ?09/10/2021 6:25 PM  ? ? ?

## 2021-09-10 NOTE — Discharge Instructions (Signed)

## 2021-09-10 NOTE — Anesthesia Procedure Notes (Signed)
Procedure Name: LMA Insertion ?Date/Time: 09/10/2021 5:54 PM ?Performed by: Philbert Riser, CRNA ?Pre-anesthesia Checklist: Patient identified, Patient being monitored, Timeout performed, Emergency Drugs available and Suction available ?Patient Re-evaluated:Patient Re-evaluated prior to induction ?Oxygen Delivery Method: Circle system utilized ?Preoxygenation: Pre-oxygenation with 100% oxygen ?Induction Type: IV induction ?Ventilation: Mask ventilation without difficulty ?LMA: LMA inserted ?LMA Size: 3.0 ?Tube type: Oral ?Number of attempts: 1 ?Placement Confirmation: positive ETCO2 and breath sounds checked- equal and bilateral ?Tube secured with: Tape ?Dental Injury: Teeth and Oropharynx as per pre-operative assessment  ? ? ? ? ?

## 2021-09-10 NOTE — Anesthesia Postprocedure Evaluation (Signed)
Anesthesia Post Note ? ?Patient: Amy Fleming ? ?Procedure(s) Performed: SUCTION D&C (Uterus) ? ?Patient location during evaluation: PACU ?Anesthesia Type: General ?Level of consciousness: awake and alert ?Pain management: pain level controlled ?Vital Signs Assessment: post-procedure vital signs reviewed and stable ?Respiratory status: spontaneous breathing, nonlabored ventilation, respiratory function stable and patient connected to nasal cannula oxygen ?Cardiovascular status: blood pressure returned to baseline and stable ?Postop Assessment: no apparent nausea or vomiting ?Anesthetic complications: no ? ? ?No notable events documented. ? ? ?Last Vitals:  ?Vitals:  ? 09/10/21 1900 09/10/21 1917  ?BP: (!) 129/94 (!) 126/92  ?Pulse: (!) 101 94  ?Resp: 13 14  ?Temp: 36.8 ?C   ?SpO2: 100% 100%  ?  ?Last Pain:  ?Vitals:  ? 09/10/21 1917  ?TempSrc:   ?PainSc: 0-No pain  ? ? ?  ?  ?  ?  ?  ?  ? ?Corinda Gubler ? ? ? ? ?

## 2021-09-10 NOTE — Transfer of Care (Signed)
Immediate Anesthesia Transfer of Care Note ? ?Patient: Amy Fleming ? ?Procedure(s) Performed: SUCTION D&C (Uterus) ? ?Patient Location: PACU ? ?Anesthesia Type:General ? ?Level of Consciousness: drowsy ? ?Airway & Oxygen Therapy: Patient Spontanous Breathing and Patient connected to face mask oxygen ? ?Post-op Assessment: Report given to RN and Post -op Vital signs reviewed and stable ? ?Post vital signs: Reviewed and stable ? ?Last Vitals:  ?Vitals Value Taken Time  ?BP 124/82 09/10/21 1831  ?Temp 36.2 ?C 09/10/21 1831  ?Pulse 108 09/10/21 1838  ?Resp 16 09/10/21 1838  ?SpO2 99 % 09/10/21 1838  ?Vitals shown include unvalidated device data. ? ?Last Pain:  ?Vitals:  ? 09/10/21 1831  ?TempSrc:   ?PainSc: Asleep  ?   ? ?  ? ?Complications: No notable events documented. ?

## 2021-09-10 NOTE — H&P (Signed)
Preoperative History and Physical ? ?Amy Fleming is a 31 y.o. G2P1011 here for surgical management of missed abortion.   No significant preoperative concerns. ? ?History of Present Illness: 31 y.o. female who had a missed abortion diagnosed on 09/02/21. She presented today for a following up ultrasound to be sure. Today's ultrasound shows a non-viable fetus measuring 4.02 cm with no cardiac activity.  This is consistent with an [redacted] week gestation and essentially unchanged from one week ago.  The patient has no symptoms today. She is Rh positive.  She has previously discussed the options for management, including; expectant management, treatment with misoprostol, and dilation and curettage.  ? ?Proposed surgery: Suction, dilation and curettage ? ?Past Medical History:  ?Diagnosis Date  ? No known health problems   ? ?Past Surgical History:  ?Procedure Laterality Date  ? CESAREAN SECTION    ? ?OB History  ?Gravida Para Term Preterm AB Living  ?3 2 2     2   ?SAB IAB Ectopic Multiple Live Births  ?        2  ?  ?# Outcome Date GA Lbr Len/2nd Weight Sex Delivery Anes PTL Lv  ?3 Current           ?2 Term      Vag-Spont   LIV  ?1 Term      CS-LTranv   LIV  ?Patient denies any other pertinent gynecologic issues.  ? ?No current facility-administered medications on file prior to encounter.  ? ?Current Outpatient Medications on File Prior to Encounter  ?Medication Sig Dispense Refill  ? Prenatal MV-Min-Fe Fum-FA-DHA (PRENATAL/FOLIC ACID+DHA PO) Take 1 tablet by mouth daily.    ? ?No Known Allergies ? ?Social History:   reports that she has never smoked. She has never used smokeless tobacco. She reports that she does not drink alcohol and does not use drugs. ? ?Family History  ?Problem Relation Age of Onset  ? Diabetes Mother   ? Hypertension Maternal Grandmother   ? ? ?Review of Systems: Noncontributory ? ?PHYSICAL EXAM: ?Blood pressure 127/81, pulse 94, temperature 98 ?F (36.7 ?C), temperature source Oral, resp. rate  16, height 5' (1.524 m), weight 53.5 kg, last menstrual period 06/02/2021, SpO2 100 %. ?CONSTITUTIONAL: Well-developed, well-nourished female in no acute distress.  ?HENT:  Normocephalic, atraumatic, External right and left ear normal. Oropharynx is clear and moist ?EYES: Conjunctivae and EOM are normal. Pupils are equal, round, and reactive to light. No scleral icterus.  ?NECK: Normal range of motion, supple, no masses ?SKIN: Skin is warm and dry. No rash noted. Not diaphoretic. No erythema. No pallor. ?NEUROLGIC: Alert and oriented to person, place, and time. Normal reflexes, muscle tone coordination. No cranial nerve deficit noted. ?PSYCHIATRIC: Normal mood and affect. Normal behavior. Normal judgment and thought content. ?CARDIOVASCULAR: Normal heart rate noted, regular rhythm ?RESPIRATORY: Effort and breath sounds normal, no problems with respiration noted ?ABDOMEN: Soft, nontender, nondistended. ?PELVIC: Deferred ?MUSCULOSKELETAL: Normal range of motion. No edema and no tenderness. 2+ distal pulses. ? ?Labs: ?Results for orders placed or performed during the hospital encounter of 09/10/21 (from the past 336 hour(s))  ?CBC  ? Collection Time: 09/10/21 11:15 AM  ?Result Value Ref Range  ? WBC 8.8 4.0 - 10.5 K/uL  ? RBC 4.32 3.87 - 5.11 MIL/uL  ? Hemoglobin 13.9 12.0 - 15.0 g/dL  ? HCT 41.1 36.0 - 46.0 %  ? MCV 95.1 80.0 - 100.0 fL  ? MCH 32.2 26.0 - 34.0 pg  ? MCHC  33.8 30.0 - 36.0 g/dL  ? RDW 12.6 11.5 - 15.5 %  ? Platelets 214 150 - 400 K/uL  ? nRBC 0.0 0.0 - 0.2 %  ?Type and screen [facility]  ? Collection Time: 09/10/21 11:15 AM  ?Result Value Ref Range  ? ABO/RH(D) O POS   ? Antibody Screen NEG   ? Sample Expiration    ?  09/13/2021,2359 ?Performed at [facility], [address], [place], [place] [address] ?  ? ? ?Imaging Studies: ?US OB Comp Less 14 Wks ? ?Result Date: 08/26/2021 ?CLINICAL DATA:  Intrauterine gestation with uncertain fetal viability. EXAM: OBSTETRIC <14 WK  [place] AND TRANSVAGINAL OB US TECHNIQUE: Both transabdominal and transvaginal ultrasound examinations were performed for complete evaluation of the gestation as well as the maternal uterus, adnexal regions, and pelvic cul-de-sac. Transvaginal technique was performed to assess early pregnancy. COMPARISON:  08/10/2021.  07/14/2021. FINDINGS: Intrauterine gestational sac: Single Yolk sac:  Visualized Embryo:  Visualized Cardiac Activity: Not visualized Heart Rate: N/A  Bpm CRL:  41.1 mm   11 w   0 d                  [place] EDC: 03/17/2022 Subchorionic hemorrhage:  None visualized. Maternal uterus/adnexae: Maternal ovaries unremarkable. No free fluid. IMPRESSION: Single intrauterine gestation identified with estimated 11 week 0 day gestational age with no fetal heart rate discernible on 2D grayscale or M-mode Doppler imaging. Findings meet definitive criteria for failed pregnancy. This follows SRU consensus guidelines: Diagnostic Criteria for Nonviable Pregnancy Early in the First Trimester. [employer] Mis J Med [phone]. Electronically Signed   By: Kennith Center M.D.   On: 08/26/2021 13:34   ? ?Assessment: ?Patient Active Problem List  ? Diagnosis Date Noted  ? Missed abortion 09/10/2021  ? ? ?Plan: ? ?Discussed options again to be thorough. Patient already has decided for [facility]. I discussed in detail with her the risks and benefits of the procedure, including but not limited to; bleeding, infection, damage to nearby organs (bowel, bladder, nerves, blood vessels, etc.), uterine perforation, uterine adhesions, need for blood transfusion in the event of significant blood loss.  She voiced understanding and desire to proceed.  All questions answered.   ? ?Patient will undergo surgical management with suction, dilation and curettage.   The risks of surgery were discussed in detail with the patient including but not limited to: bleeding which may require transfusion or reoperation; infection which may require antibiotics; injury to  surrounding organs which may involve bowel, bladder, ureters ; need for additional procedures including laparoscopy or laparotomy; thromboembolic phenomenon, surgical site problems and other postoperative/anesthesia complications. Likelihood of success in alleviating the patient's condition was discussed. Routine postoperative instructions will be reviewed with the patient and her family in detail after surgery.  The patient concurred with the proposed plan, giving informed written consent for the surgery.  Anesthesia and OR aware.  Preoperative prophylactic antibiotics, as indicated, and SCDs ordered on call to the OR.  To OR when ready. ? ?Thomasene Mohair, MD ?09/10/2021 1:24 PM    ?

## 2021-09-11 ENCOUNTER — Encounter: Payer: Self-pay | Admitting: Obstetrics and Gynecology

## 2021-09-14 LAB — SURGICAL PATHOLOGY

## 2021-12-26 ENCOUNTER — Other Ambulatory Visit: Payer: Self-pay

## 2021-12-26 DIAGNOSIS — Z20822 Contact with and (suspected) exposure to covid-19: Secondary | ICD-10-CM | POA: Diagnosis not present

## 2021-12-26 DIAGNOSIS — R197 Diarrhea, unspecified: Secondary | ICD-10-CM | POA: Insufficient documentation

## 2021-12-26 DIAGNOSIS — F419 Anxiety disorder, unspecified: Secondary | ICD-10-CM | POA: Insufficient documentation

## 2021-12-26 DIAGNOSIS — E860 Dehydration: Secondary | ICD-10-CM | POA: Insufficient documentation

## 2021-12-26 DIAGNOSIS — R110 Nausea: Secondary | ICD-10-CM | POA: Insufficient documentation

## 2021-12-26 DIAGNOSIS — E876 Hypokalemia: Secondary | ICD-10-CM | POA: Diagnosis not present

## 2021-12-26 LAB — POC URINE PREG, ED: Preg Test, Ur: NEGATIVE

## 2021-12-26 NOTE — ED Triage Notes (Signed)
Anxiety with panic attack. Reports hx of same and recently started on Zoloft. Reports chest tightness and SOB. Pt breathing unlabored in triage and speaking in full sentences with noted symmetric chest rise and fall. Denies h/a. Reports intermittent dizziness. Pt ambulatory in triage.

## 2021-12-27 ENCOUNTER — Emergency Department
Admission: EM | Admit: 2021-12-27 | Discharge: 2021-12-27 | Disposition: A | Discharge status: Home / Self Care | Payer: Medicaid Other | Attending: Emergency Medicine | Admitting: Emergency Medicine

## 2021-12-27 DIAGNOSIS — F419 Anxiety disorder, unspecified: Secondary | ICD-10-CM

## 2021-12-27 DIAGNOSIS — E860 Dehydration: Secondary | ICD-10-CM

## 2021-12-27 DIAGNOSIS — R197 Diarrhea, unspecified: Secondary | ICD-10-CM

## 2021-12-27 DIAGNOSIS — E876 Hypokalemia: Secondary | ICD-10-CM

## 2021-12-27 HISTORY — DX: Encounter for other specified aftercare: Z51.89

## 2021-12-27 LAB — URINALYSIS, ROUTINE W REFLEX MICROSCOPIC
Bacteria, UA: NONE SEEN
Bilirubin Urine: NEGATIVE
Glucose, UA: NEGATIVE mg/dL
Ketones, ur: 20 mg/dL — AB
Nitrite: NEGATIVE
Protein, ur: NEGATIVE mg/dL
Specific Gravity, Urine: 1.028 (ref 1.005–1.030)
pH: 6 (ref 5.0–8.0)

## 2021-12-27 LAB — CBC WITH DIFFERENTIAL/PLATELET
Abs Immature Granulocytes: 0.05 10*3/uL (ref 0.00–0.07)
Basophils Absolute: 0 10*3/uL (ref 0.0–0.1)
Basophils Relative: 0 %
Eosinophils Absolute: 0.3 10*3/uL (ref 0.0–0.5)
Eosinophils Relative: 2 %
HCT: 39.8 % (ref 36.0–46.0)
Hemoglobin: 13.3 g/dL (ref 12.0–15.0)
Immature Granulocytes: 0 %
Lymphocytes Relative: 29 %
Lymphs Abs: 3.7 10*3/uL (ref 0.7–4.0)
MCH: 30.8 pg (ref 26.0–34.0)
MCHC: 33.4 g/dL (ref 30.0–36.0)
MCV: 92.1 fL (ref 80.0–100.0)
Monocytes Absolute: 0.7 10*3/uL (ref 0.1–1.0)
Monocytes Relative: 5 %
Neutro Abs: 8.2 10*3/uL — ABNORMAL HIGH (ref 1.7–7.7)
Neutrophils Relative %: 64 %
Platelets: 242 10*3/uL (ref 150–400)
RBC: 4.32 MIL/uL (ref 3.87–5.11)
RDW: 12.2 % (ref 11.5–15.5)
WBC: 13 10*3/uL — ABNORMAL HIGH (ref 4.0–10.5)
nRBC: 0 % (ref 0.0–0.2)

## 2021-12-27 LAB — COMPREHENSIVE METABOLIC PANEL
ALT: 11 U/L (ref 0–44)
AST: 16 U/L (ref 15–41)
Albumin: 4.3 g/dL (ref 3.5–5.0)
Alkaline Phosphatase: 78 U/L (ref 38–126)
Anion gap: 5 (ref 5–15)
BUN: 12 mg/dL (ref 6–20)
CO2: 24 mmol/L (ref 22–32)
Calcium: 8.9 mg/dL (ref 8.9–10.3)
Chloride: 109 mmol/L (ref 98–111)
Creatinine, Ser: 0.6 mg/dL (ref 0.44–1.00)
GFR, Estimated: >60 mL/min (ref >60)
Glucose, Bld: 93 mg/dL (ref 70–99)
Potassium: 3.4 mmol/L — ABNORMAL LOW (ref 3.5–5.1)
Sodium: 138 mmol/L (ref 135–145)
Total Bilirubin: 0.7 mg/dL (ref 0.3–1.2)
Total Protein: 7.6 g/dL (ref 6.5–8.1)

## 2021-12-27 LAB — MAGNESIUM: Magnesium: 2.1 mg/dL (ref 1.7–2.4)

## 2021-12-27 LAB — SARS CORONAVIRUS 2 BY RT PCR: SARS Coronavirus 2 by RT PCR: NEGATIVE

## 2021-12-27 MED ORDER — POTASSIUM CHLORIDE CRYS ER 20 MEQ PO TBCR
40.0000 meq | EXTENDED_RELEASE_TABLET | Freq: Once | ORAL | Status: AC
  Administered 2021-12-27: 40 meq via ORAL
  Filled 2021-12-27: qty 2

## 2021-12-27 MED ORDER — LORAZEPAM 1 MG PO TABS
1.0000 mg | ORAL_TABLET | Freq: Once | ORAL | Status: AC
  Administered 2021-12-27: 1 mg via ORAL
  Filled 2021-12-27: qty 1

## 2021-12-27 MED ORDER — LACTATED RINGERS IV BOLUS
1000.0000 mL | Freq: Once | INTRAVENOUS | Status: AC
  Administered 2021-12-27: 1000 mL via INTRAVENOUS

## 2021-12-27 NOTE — ED Provider Notes (Signed)
[facility] Provider Note    Event Date/Time   First MD Initiated Contact with Patient 12/27/21 0153     (approximate)   History   Anxiety   HPI  Amy Fleming is a 31 y.o. female who presents to the ED for evaluation of Anxiety   I reviewed telemedicine visit from 8/11 the patient was seen for anxiety after recent miscarriage and suction D&C performed 4/27.  Patient presents to the ED with her husband for evaluation of dizziness, diarrhea and nausea.  She reports 1-2 weeks of nausea without any abdominal pain or emesis.  As well as about 1 week of watery diarrhea, up to 3 episodes every morning.  Reports often feeling nauseous throughout the day but no postprandial abdominal pain, epigastric pain or any episodes of emesis.  Reports of presyncope dizziness with standing.   She reports developing anxiety related to the symptoms and is not sure if something is wrong so she presents to the ED for evaluation.  Denies any dysuria, fever, syncope or falls.  Physical Exam   Triage Vital Signs: ED Triage Vitals  Enc Vitals Group     BP 12/26/21 2313 (!) 179/108     Pulse Rate 12/26/21 2313 92     Resp 12/26/21 2313 (!) 22     Temp 12/26/21 2313 98.6 F (37 C)     Temp Source 12/26/21 2313 Oral     SpO2 12/26/21 2313 100 %     Weight 12/26/21 2310 119 lb (54 kg)     Height 12/26/21 2310 5' (1.524 m)     Head Circumference --      Peak Flow --      Pain Score 12/26/21 2309 6     Pain Loc --      Pain Edu? --      Excl. in GC? --     Most recent vital signs: Vitals:   12/27/21 0156 12/27/21 0607  BP: (!) 144/85 133/77  Pulse: 78 92  Resp: 18 16  Temp: 98.6 F (37 C) 98 F (36.7 C)  SpO2: 100% 99%    General: Awake, no distress.  Dry mucous membranes.  Pleasant and conversational. CV:  Good peripheral perfusion. RRR Resp:  Normal effort. CTAB Abd:  No distention.  Soft and benign throughout MSK:  No deformity noted.  Neuro:  No focal  deficits appreciated. Cranial nerves II through XII intact 5/5 strength and sensation in all 4 extremities Other:     ED Results / Procedures / Treatments   Labs (all labs ordered are listed, but only abnormal results are displayed) Labs Reviewed  COMPREHENSIVE METABOLIC PANEL - Abnormal; Notable for the following components:      Result Value   Potassium 3.4 (*)    All other components within normal limits  CBC WITH DIFFERENTIAL/PLATELET - Abnormal; Notable for the following components:   WBC 13.0 (*)    Neutro Abs 8.2 (*)    All other components within normal limits  URINALYSIS, ROUTINE W REFLEX MICROSCOPIC - Abnormal; Notable for the following components:   Color, Urine YELLOW (*)    APPearance HAZY (*)    Hgb urine dipstick MODERATE (*)    Ketones, ur 20 (*)    Leukocytes,Ua TRACE (*)    All other components within normal limits  SARS CORONAVIRUS 2 BY RT PCR  MAGNESIUM  POC URINE PREG, ED    EKG Sinus rhythm with a rate of 94 bpm.  Normal axis and intervals.  No clear signs of acute ischemia.  RADIOLOGY   Official radiology report(s): No results found.  PROCEDURES and INTERVENTIONS:  Procedures  Medications  lactated ringers bolus 1,000 mL (0 mLs Intravenous Stopped 12/27/21 0448)  potassium chloride SA (KLOR-CON M) CR tablet 40 mEq (40 mEq Oral Given 12/27/21 0424)  LORazepam (ATIVAN) tablet 1 mg (1 mg Oral Given 12/27/21 0426)     IMPRESSION / MDM / ASSESSMENT AND PLAN / ED COURSE  I reviewed the triage vital signs and the nursing notes.  Differential diagnosis includes, but is not limited to, dehydration, viral syndrome, anxiety, medication side effect, acute cystitis, electrolyte derangement, cardiac dysrhythmia  {Patient presents with symptoms of an acute illness or injury that is potentially life-threatening.  Pleasant 31 year old female presents to the ED with subacute nausea and diarrhea causing mild dehydration.  She was systemically well and has a  benign abdomen.  Mild leukocytosis is noted as well as mild hypokalemia.  Magnesium is normal.  Urine with ketones suggestive of dehydration, but her lack of urinary symptoms makes cystitis unlikely.  She is not pregnant.  Considering her benign abdomen, despite her leukocytosis, I do not see any indications for advanced imaging of the abdomen at this point.  We will provide IV fluids, replete her electrolytes and reassess.  She has resolving symptoms after anxiolytics and IV fluids.  Reassuring work-up.  Does have some signs of dehydration but she is suitable for outpatient management.  Tolerated p.o. intake.  No signs of psychiatric emergency to require IVC or emergent psychiatric evaluation.  Clinical Course as of 12/27/21 0645  Sun Dec 27, 2021  0631 Reassessed.  Feeling well.  We discussed signs of dehydration, mild hypokalemia. [DS]    Clinical Course User Index [DS] Delton Prairie, MD     FINAL CLINICAL IMPRESSION(S) / ED DIAGNOSES   Final diagnoses:  Dehydration  Diarrhea, unspecified type  Anxiety  Hypokalemia     Rx / DC Orders   ED Discharge Orders     None        Note:  This document was prepared using Dragon voice recognition software and may include unintentional dictation errors.   Delton Prairie, MD 12/27/21 [phone]

## 2023-05-05 ENCOUNTER — Ambulatory Visit
Admission: RE | Admit: 2023-05-05 | Discharge: 2023-05-05 | Disposition: A | Discharge status: Home / Self Care | Payer: Medicaid Other | Source: Ambulatory Visit | Attending: Family Medicine | Admitting: Family Medicine

## 2023-05-05 VITALS — BP 136/96 | HR 79 | Temp 98.0°F | Resp 18

## 2023-05-05 DIAGNOSIS — B379 Candidiasis, unspecified: Secondary | ICD-10-CM

## 2023-05-05 DIAGNOSIS — N3001 Acute cystitis with hematuria: Secondary | ICD-10-CM

## 2023-05-05 LAB — URINALYSIS, W/ REFLEX TO CULTURE (INFECTION SUSPECTED)
Glucose, UA: 100 mg/dL — AB
Leukocytes,Ua: NEGATIVE
Nitrite: POSITIVE — AB
Protein, ur: 30 mg/dL — AB
Specific Gravity, Urine: 1.02 (ref 1.005–1.030)
pH: 7.5 (ref 5.0–8.0)

## 2023-05-05 MED ORDER — FLUCONAZOLE 150 MG PO TABS: 150.0000 mg | ORAL_TABLET | ORAL | 0 refills | Status: AC

## 2023-05-05 MED ORDER — SULFAMETHOXAZOLE-TRIMETHOPRIM 800-160 MG PO TABS: 1.0000 | ORAL_TABLET | Freq: Two times a day (BID) | ORAL | 0 refills | Status: AC

## 2023-05-05 NOTE — ED Triage Notes (Signed)
Had a virtual appointment this past Sunday for a uti and was prescribed pyridium and nitrofurantoin and it was working but then kept having to pee a lot all day yesterday and it's been uncomfortable not sure if the medicine is helping at all   Finish pyridium yesterday. No burning. Back pain.  LMP 04/25/23

## 2023-05-05 NOTE — Discharge Instructions (Addendum)
Stop by the pharmacy to pick up your prescriptions.  Follow up with your primary care provider as needed.  

## 2023-05-05 NOTE — ED Provider Notes (Signed)
[facility]    CSN: [id number] Arrival date & time: 05/05/23  0825      History   Chief Complaint Chief Complaint  Patient presents with   Urinary Frequency    Had a virtual appointment this past Sunday for a uti and was prescribed pyridium and nitrofurantoin and it was working but then  kept having to pee a lot all day yesterday and it's been uncomfortable not sure if the medicine is helping at all - Entered by patient   Back Pain     HPI HPI Milissia Hutzel is a 32 y.o. female.    Valenica Abella presents for back pain with urinary frequency  She did a virtual visit and was prescribed pyridium and nitrofurantoin. Has been taking the antibiotic for 3 days.   The medication seemed like it was helping. Last night,the urinary frequency and back pain returned.  She is still taking her antibiotic. When she was in high school had a kidney infection that got bad and she doesn't want it to get that far.  No fevers, vomiting or nausea.        Past Medical History:  Diagnosis Date   Blood transfusion without reported diagnosis    No known health problems     Patient Active Problem List   Diagnosis Date Noted   Missed abortion 09/10/2021    Past Surgical History:  Procedure Laterality Date   CESAREAN SECTION     DILATION AND EVACUATION N/A 09/10/2021   Procedure: SUCTION D&C;  Surgeon: Conard Novak, MD;  Location: [facility] ORS;  Service: Gynecology;  Laterality: N/A;    OB History     Gravida  3   Para  2   Term  2   Preterm      AB      Living  2      SAB      IAB      Ectopic      Multiple      Live Births  2            Home Medications    Prior to Admission medications   Medication Sig Start Date End Date Taking? Authorizing Provider  amLODipine (NORVASC) 5 MG tablet Take by mouth. 12/30/21 03/29/24 Yes [provider]  fluconazole (DIFLUCAN) 150 MG tablet Take 1 tablet (150 mg total) by mouth every 3  (three) days for 2 doses. 05/05/23 05/09/23 Yes Schyler Butikofer, Seward Meth, DO  nitrofurantoin, macrocrystal-monohydrate, (MACROBID) 100 MG capsule Take by mouth. 05/01/23 05/08/23 Yes [provider]  sulfamethoxazole-trimethoprim (BACTRIM DS) 800-160 MG tablet Take 1 tablet by mouth 2 (two) times daily for 5 days. 05/05/23 05/10/23 Yes Tomasz Steeves, DO  ibuprofen (ADVIL) 600 MG tablet Take 1 tablet (600 mg total) by mouth every 6 (six) hours. 09/10/21   Conard Novak, MD  Prenatal MV-Min-Fe Fum-FA-DHA (PRENATAL/FOLIC ACID+DHA PO) Take 1 tablet by mouth daily.    [provider]    Family History Family History  Problem Relation Age of Onset   Diabetes Mother    Hypertension Maternal Grandmother     Social History Social History   Tobacco Use   Smoking status: Never   Smokeless tobacco: Never  Vaping Use   Vaping status: Never Used  Substance Use Topics   Alcohol use: Never   Drug use: Never     Allergies   Patient has no known allergies.   Review of Systems Review of Systems: :negative unless  otherwise stated in HPI.      Physical Exam Triage Vital Signs ED Triage Vitals  Encounter Vitals Group     BP 05/05/23 0849 (!) 136/96     Systolic BP Percentile --      Diastolic BP Percentile --      Pulse Rate 05/05/23 0849 79     Resp 05/05/23 0849 18     Temp 05/05/23 0849 98 F (36.7 C)     Temp Source 05/05/23 0849 Oral     SpO2 05/05/23 0849 96 %     Weight --      Height --      Head Circumference --      Peak Flow --      Pain Score 05/05/23 0848 0     Pain Loc --      Pain Education --      Exclude from Growth Chart --    No data found.  Updated Vital Signs BP (!) 136/96 (BP Location: Left Arm)   Pulse 79   Temp 98 F (36.7 C) (Oral)   Resp 18   LMP 04/25/2023 (Exact Date)   SpO2 96%   Visual Acuity Right Eye Distance:   Left Eye Distance:   Bilateral Distance:    Right Eye Near:   Left Eye Near:    Bilateral Near:      Physical Exam GEN: well appearing female in no acute distress  CVS: well perfused  RESP: speaking in full sentences without pause  ABD: soft, non-tender, non-distended, no palpable masses, no CVA tenderness       UC Treatments / Results  Labs (all labs ordered are listed, but only abnormal results are displayed) Labs Reviewed  URINALYSIS, W/ REFLEX TO CULTURE (INFECTION SUSPECTED) - Abnormal; Notable for the following components:      Result Value   Glucose, UA 100 (*)    Hgb urine dipstick SMALL (*)    Bilirubin Urine SMALL (*)    Ketones, ur TRACE (*)    Protein, ur 30 (*)    Nitrite POSITIVE (*)    Bacteria, UA FEW (*)    All other components within normal limits  URINE CULTURE    EKG   Radiology No results found.  Procedures Procedures (including critical care time)  Medications Ordered in UC Medications - No data to display  Initial Impression / Assessment and Plan / UC Course  I have reviewed the triage vital signs and the nursing notes.  Pertinent labs & imaging results that were available during my care of the patient were reviewed by me and considered in my medical decision making (see chart for details).     Acute cystitis:  Patient is a 32 y.o. female  who presents for dysuria, back pain and urinary frequency despite being on antibiotics for UTI that were prescribed via a virtual visit.  Patient was prescribed Macrobid twice daily for 5 days via telemedicine visit on 05/01/2023 by an outside provider.   Overall patient is well-appearing and afebrile.  Vital signs stable.  UA consistent with acute cystitis.  Hematuria not supported on microscopy.  Treat with Bactrim 2 times daily for 5 days.  Urine culture obtained.  Follow-up sensitivities and change antibiotics, if needed.  Diflucan prescribed as she had some budding yeast in her urine.  Suspect this is secondary to yeast from antibiotic usage.  Return precautions including abdominal pain, fever,  chills, nausea, or vomiting given. Follow-up,  if symptoms not improving  or getting worse. Discussed MDM, treatment plan and plan for follow-up with patient who agrees with plan.        Final Clinical Impressions(s) / UC Diagnoses   Final diagnoses:  Acute cystitis with hematuria  Yeast infection     Discharge Instructions      Stop by the pharmacy to pick up your prescriptions.  Follow up with your primary care provider as needed.      ED Prescriptions     Medication Sig Dispense Auth. Provider   sulfamethoxazole-trimethoprim (BACTRIM DS) 800-160 MG tablet Take 1 tablet by mouth 2 (two) times daily for 5 days. 10 tablet Dannie Woolen, DO   fluconazole (DIFLUCAN) 150 MG tablet Take 1 tablet (150 mg total) by mouth every 3 (three) days for 2 doses. 2 tablet Katha Cabal, DO      PDMP not reviewed this encounter.   Katha Cabal, DO 05/05/23 1814

## 2023-05-06 LAB — URINE CULTURE: Culture: <10000 — AB

## 2023-09-04 IMAGING — US US OB < 14 WEEKS - US OB TV
1 series · 14 of 28 positions shown · non-contrast
Comparison: None.

CLINICAL DATA: Abdominal cramping and vaginal bleeding

EXAM:
OBSTETRIC <14 WK US AND TRANSVAGINAL OB US
TECHNIQUE: Both transabdominal and transvaginal ultrasound examinations were
performed for complete evaluation of the gestation as well as the
maternal uterus, adnexal regions, and pelvic cul-de-sac.
Transvaginal technique was performed to assess early pregnancy.

[Series 1: us ob less than 14 weeks with ob transvaginal · 75 acquisitions, 14 frames shown]
[im 3/75]
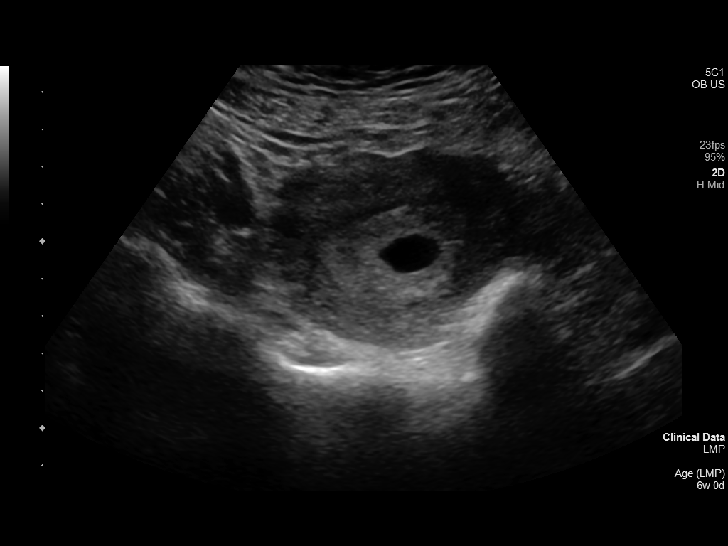
[im 9/75]
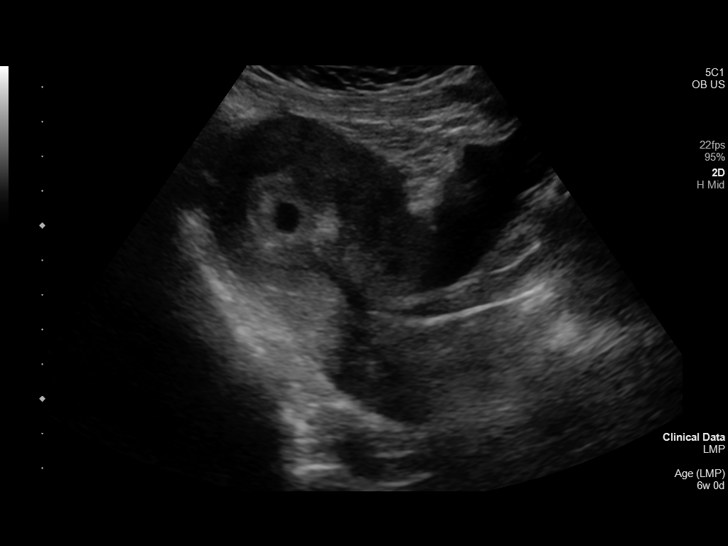
[im 14/75]
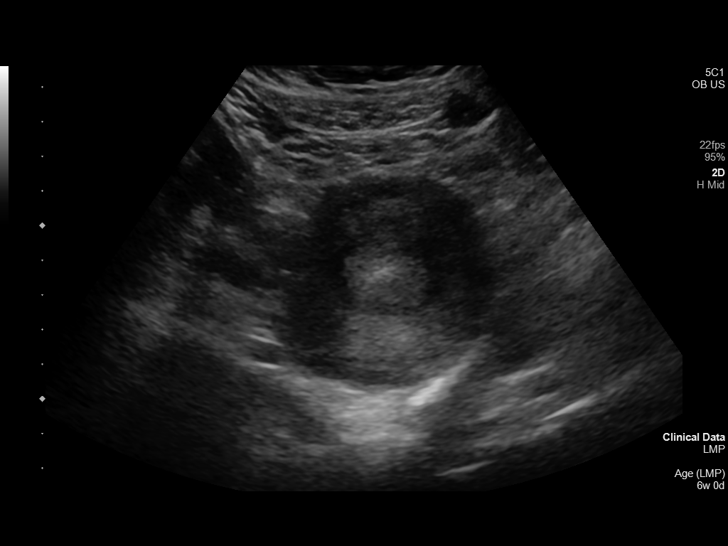
[im 20/75]
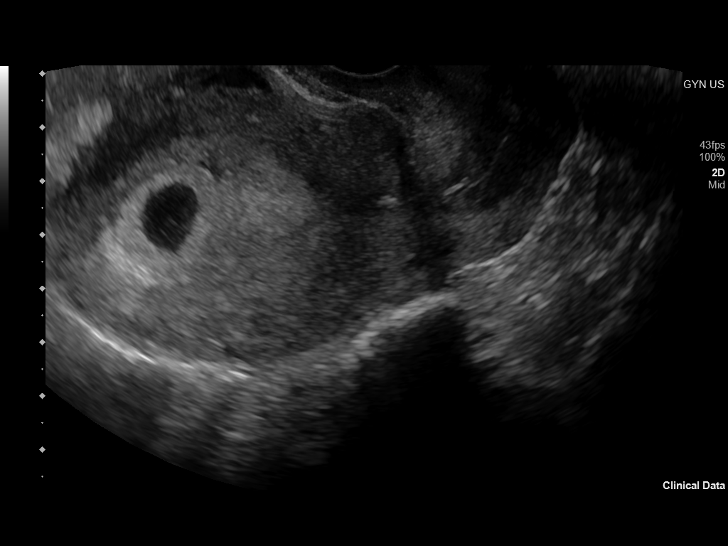
[im 25/75]
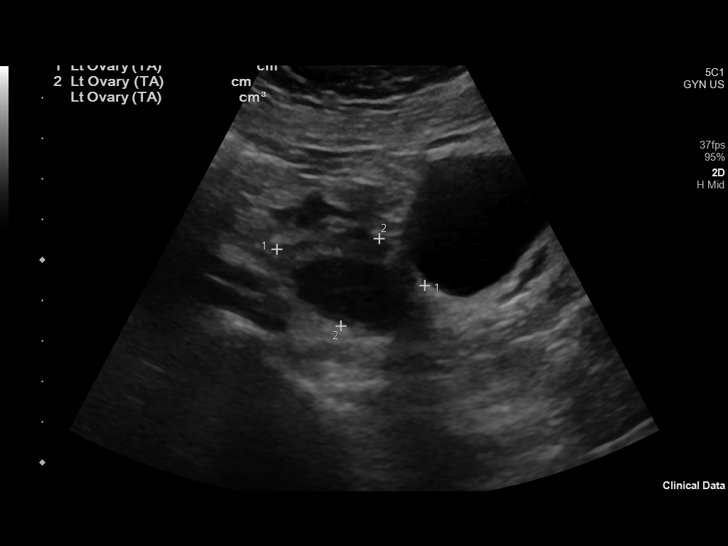
[im 31/75]
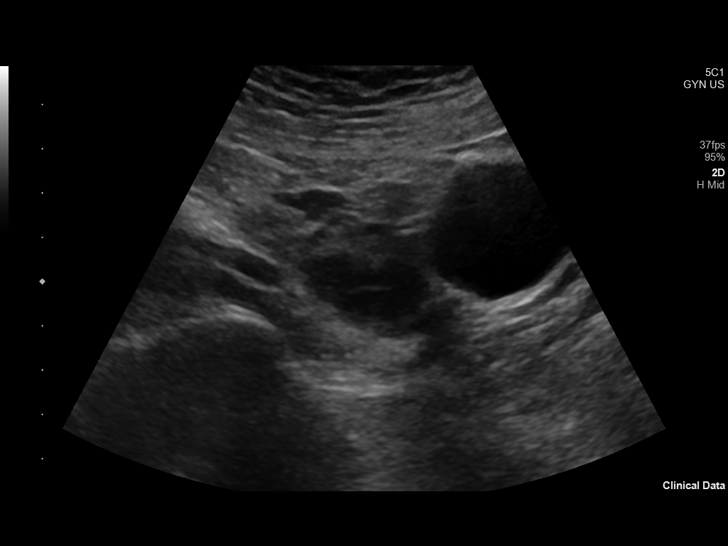
[im 36/75]
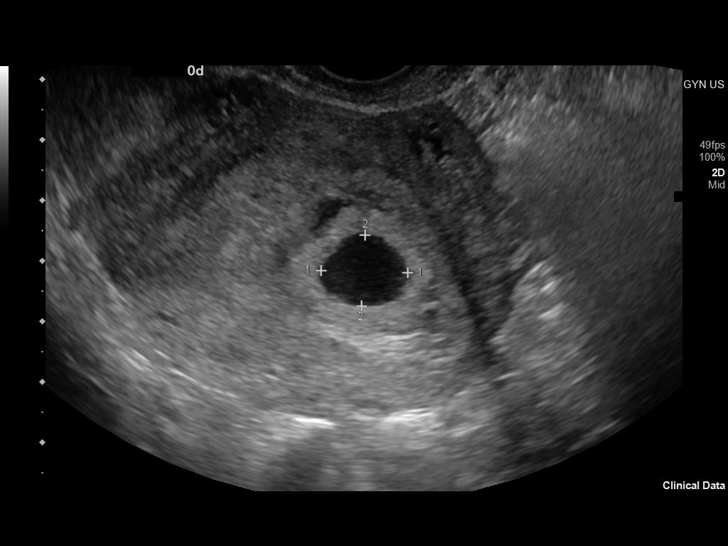
[im 42/75]
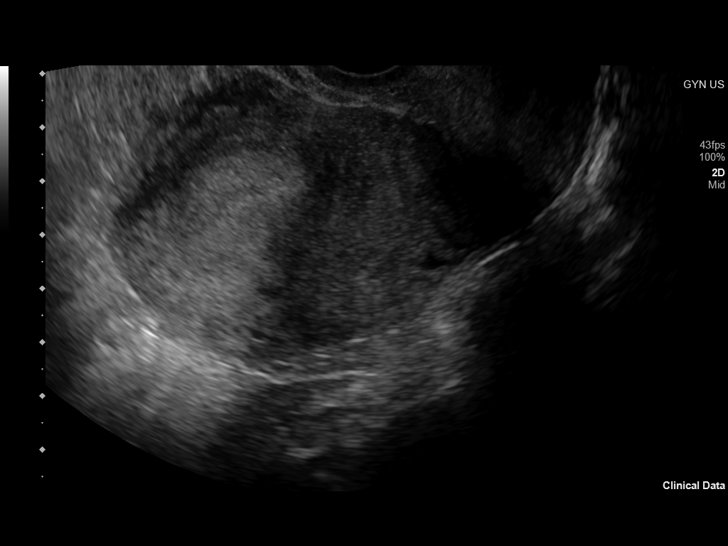
[im 47/75]
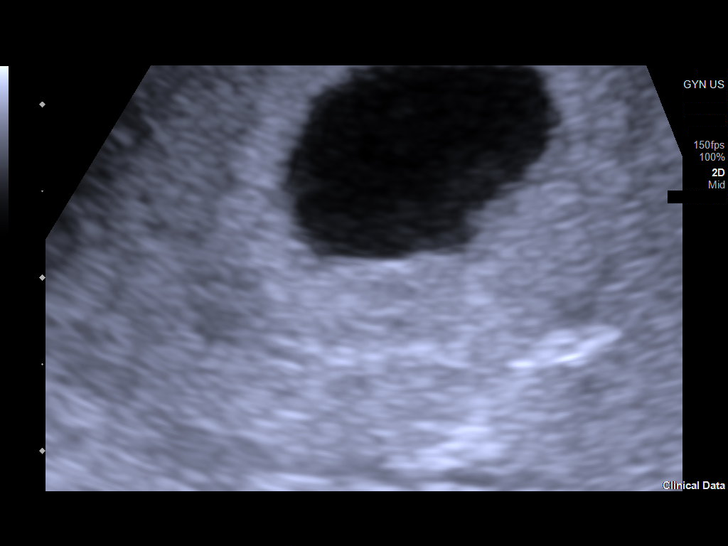
[im 53/75]
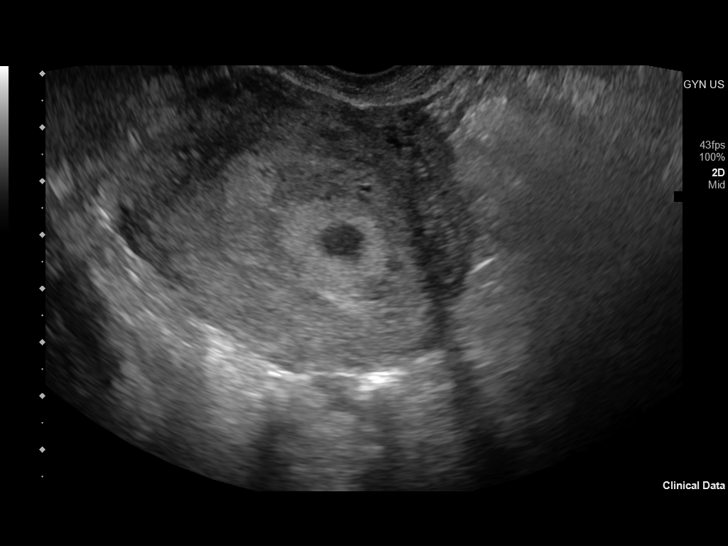
[im 58/75]
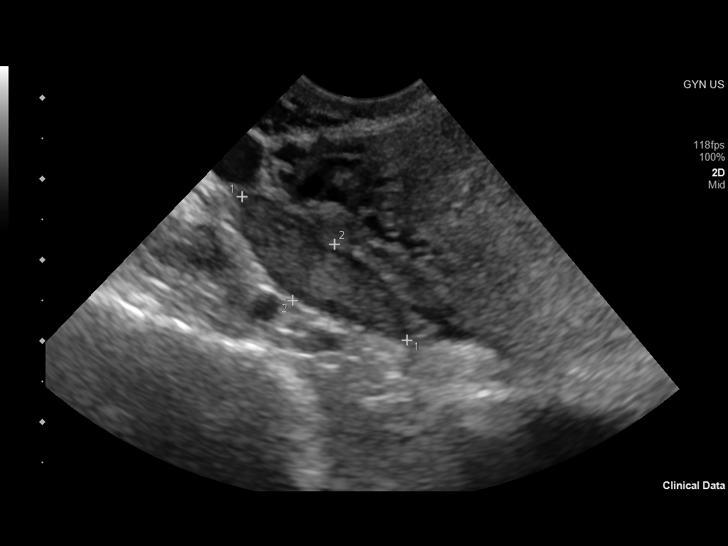
[im 64/75]
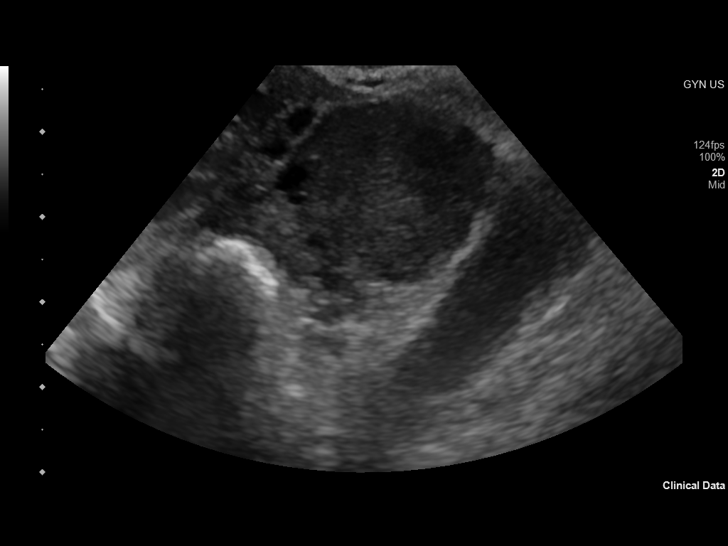
[im 69/75]
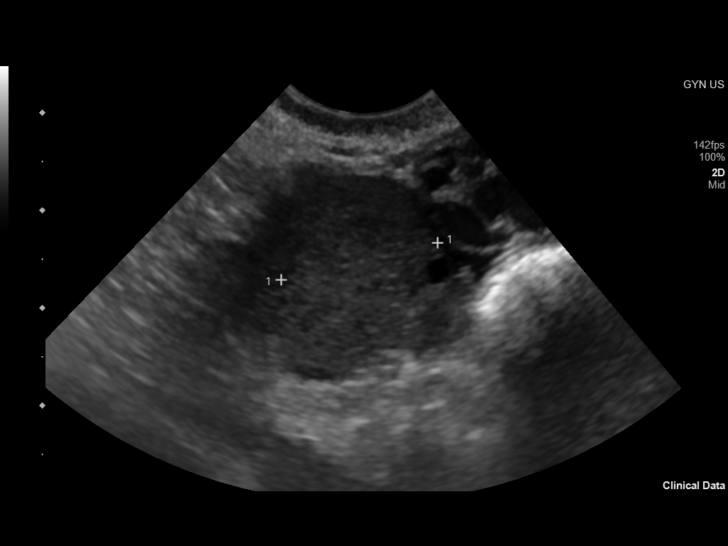
[im 75/75]
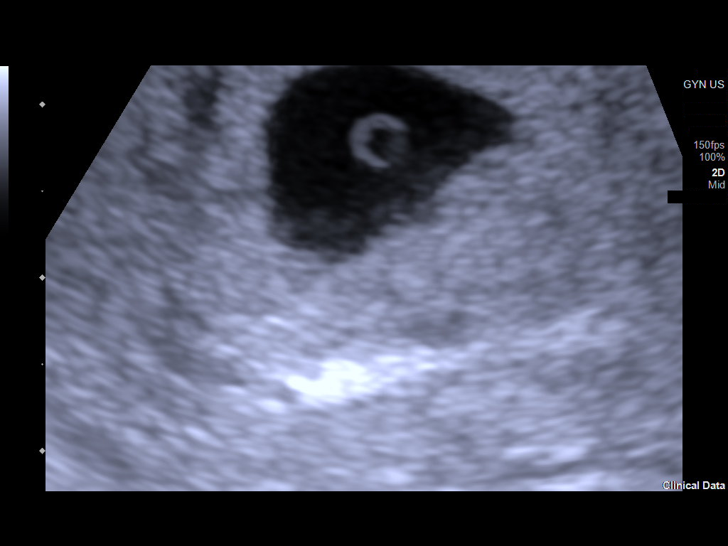

[14 of 28 positions shown; findings below may reference images not displayed]

FINDINGS: Intrauterine gestational sac: Single

Yolk sac:  Visualized.

Embryo:  Not Visualized.

Cardiac Activity: Not Visualized.

Heart Rate: NA

MSD: 12  mm   6 w   0  d

Subchorionic hemorrhage:  None visualized.

Maternal uterus/adnexae: Normal appearance of the right ovary.
Possible corpus luteum cyst of the left ovary. No free fluid.
IMPRESSION: Single early intrauterine pregnancy with gestational and yolk sac
visualized, but no definite fetal pole. Estimated gestational age of
6 weeks 0 days based on mean sac diameter.

## 2023-10-17 IMAGING — US US OB COMP LESS 14 WK
1 series · 15 of 28 positions shown · non-contrast
Comparison: 08/10/2021.  07/14/2021.

CLINICAL DATA: Intrauterine gestation with uncertain fetal
viability.

EXAM:
OBSTETRIC <14 WK US AND TRANSVAGINAL OB US
TECHNIQUE: Both transabdominal and transvaginal ultrasound examinations were
performed for complete evaluation of the gestation as well as the
maternal uterus, adnexal regions, and pelvic cul-de-sac.
Transvaginal technique was performed to assess early pregnancy.

[Series 1: us ob comp less 14 wks · 15 of 90 slices shown]
[im 1/90]
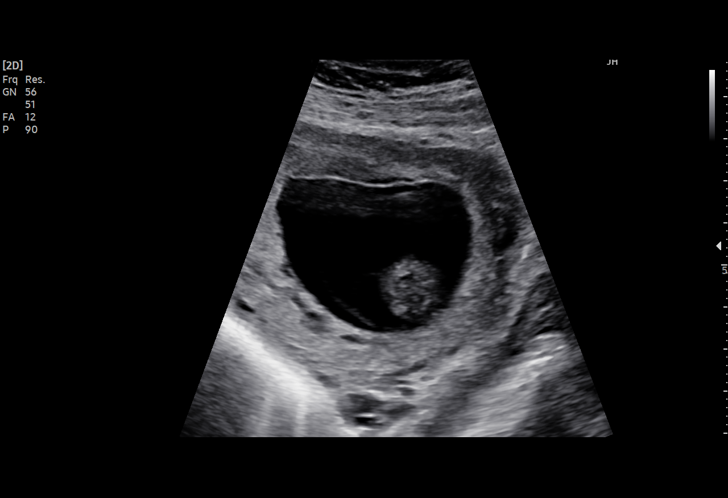
[im 7/90]
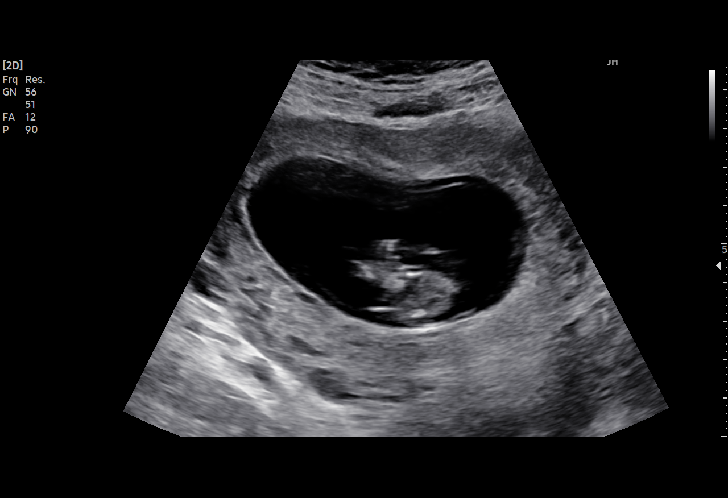
[im 14/90]
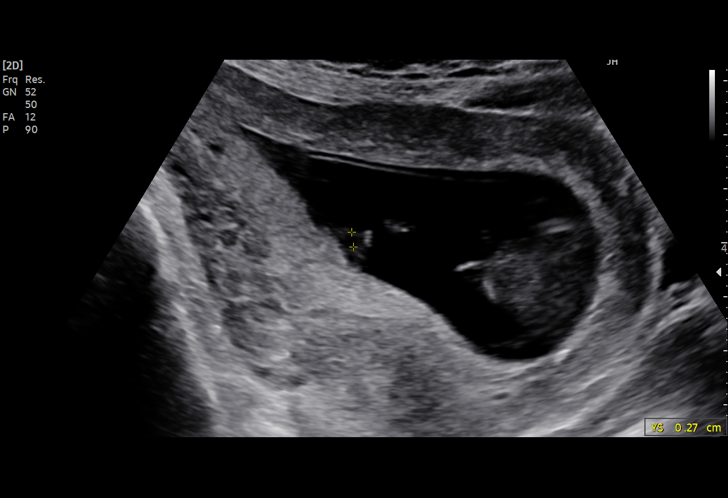
[im 20/90]
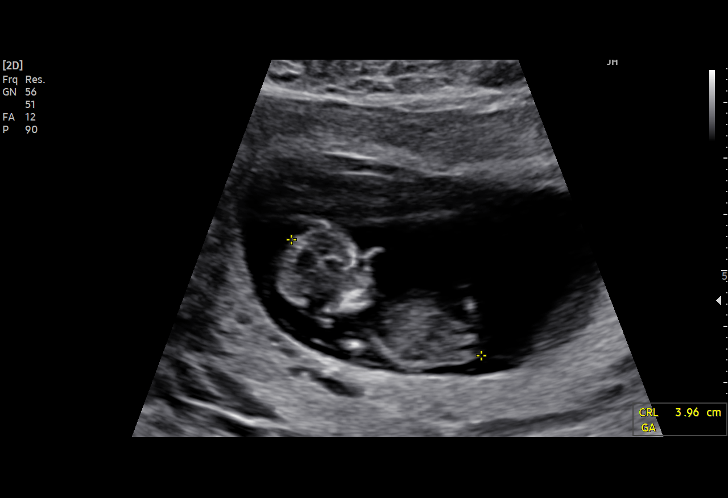
[im 27/90]
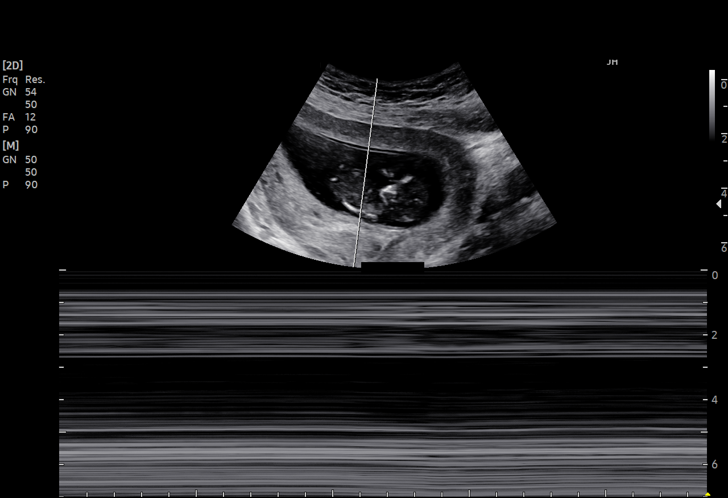
[im 33/90]
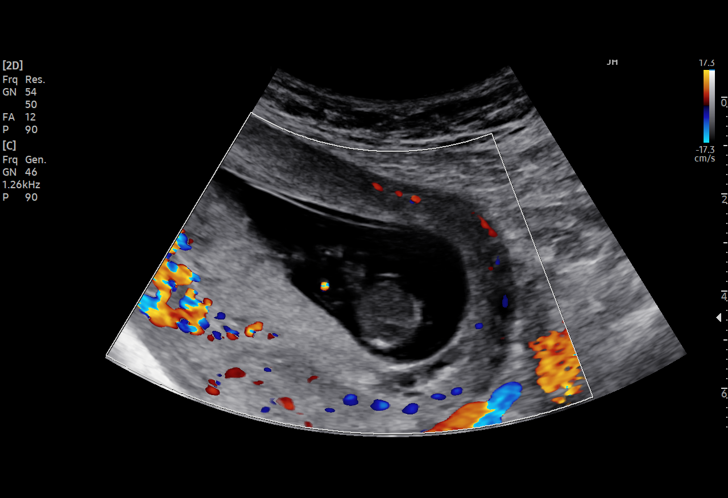
[im 40/90]
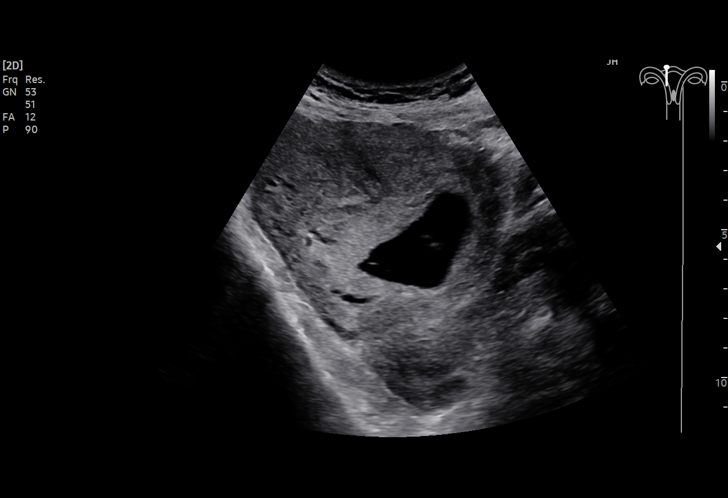
[im 47/90]
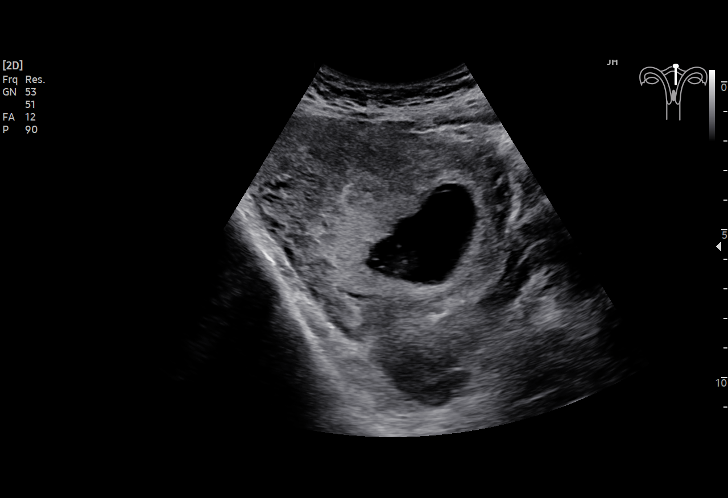
[im 50/90]
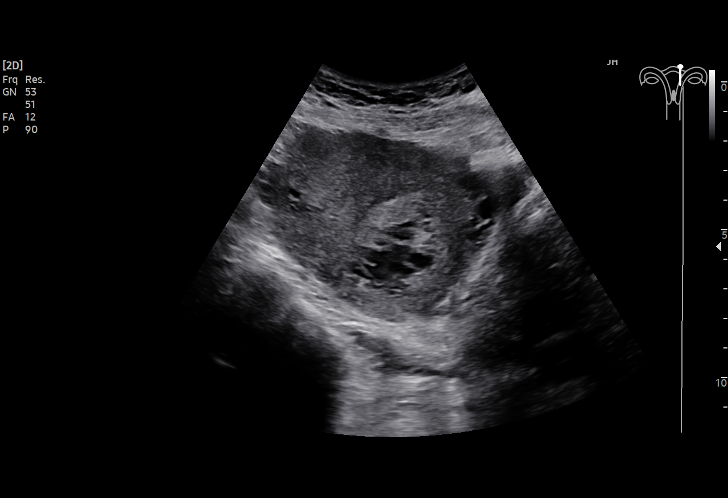
[im 57/90]
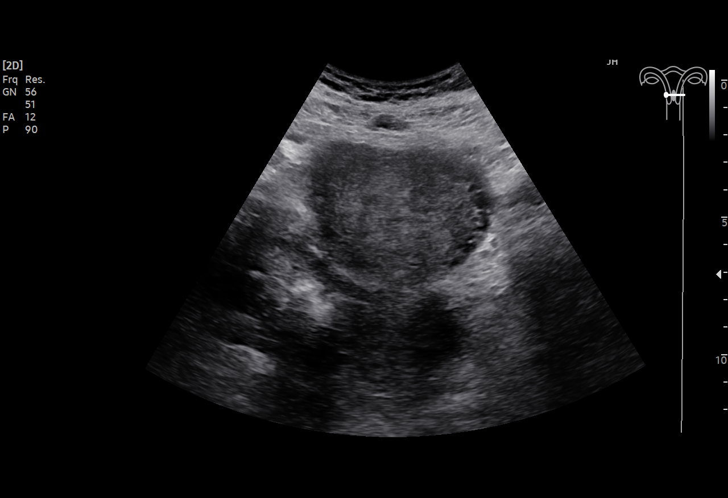
[im 63/90]
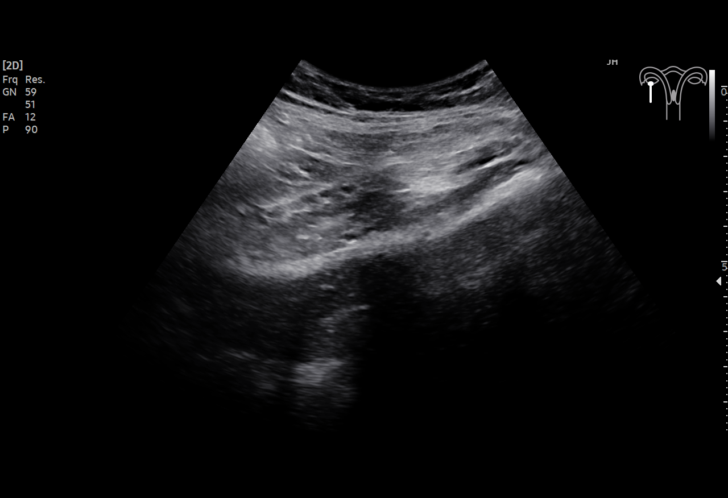
[im 70/90]
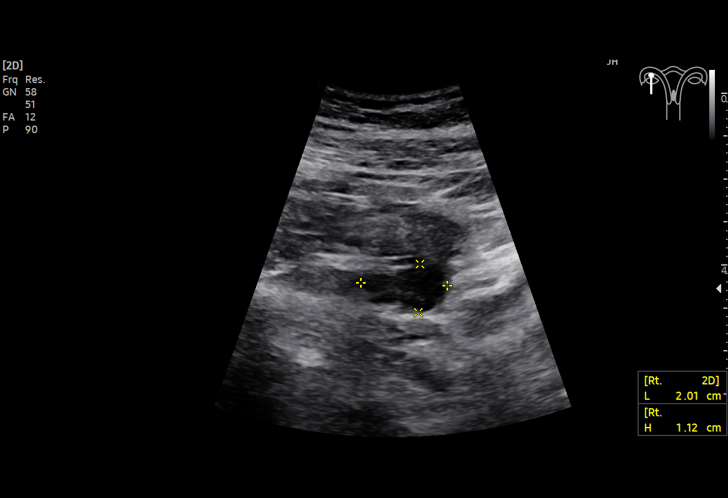
[im 76/90]
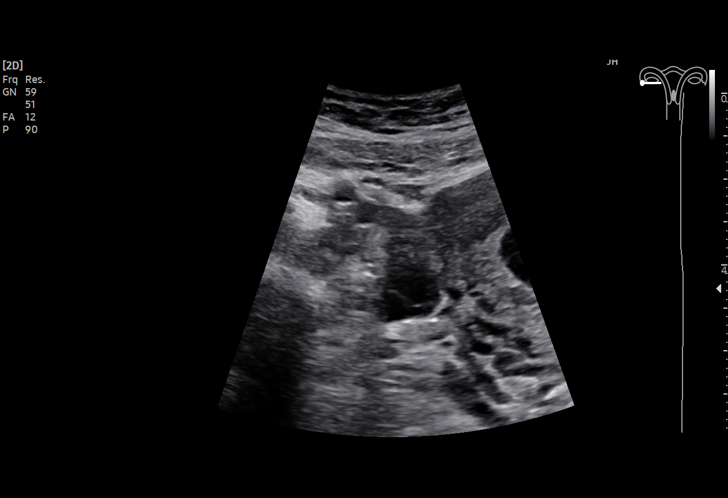
[im 83/90]
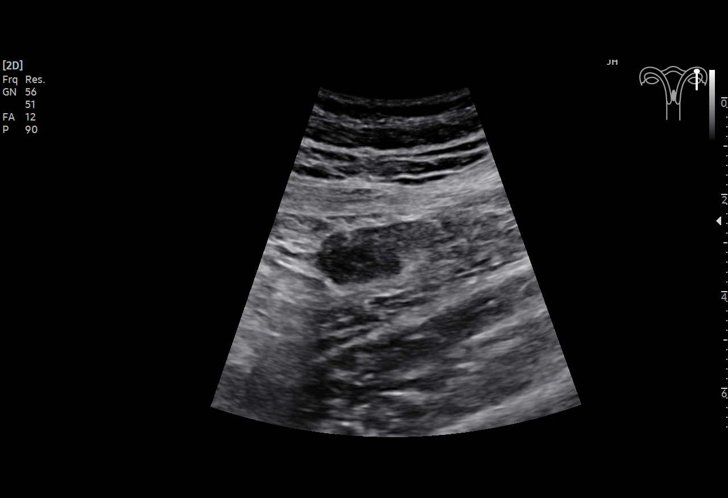
[im 90/90]
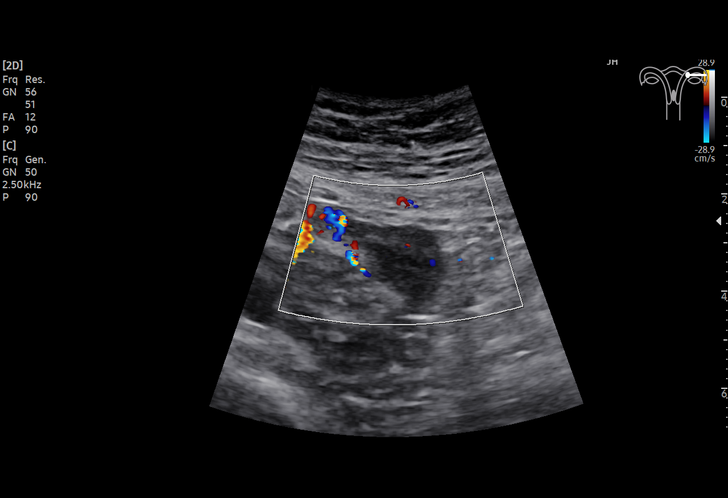

[15 of 28 positions shown; findings below may reference images not displayed]

FINDINGS: Intrauterine gestational sac: Single

Yolk sac:  Visualized

Embryo:  Visualized

Cardiac Activity: Not visualized

Heart Rate: N/A  Bpm

CRL:  41.1 mm   11 w   0 d                  US EDC: 03/17/2022

Subchorionic hemorrhage:  None visualized.

Maternal uterus/adnexae: Maternal ovaries unremarkable. No free
fluid.
IMPRESSION: Single intrauterine gestation identified with estimated 11 week 0
day gestational age with no fetal heart rate discernible on 2D
grayscale or M-mode Doppler imaging. Findings meet definitive
criteria for failed pregnancy. This follows SRU consensus
guidelines: Diagnostic Criteria for Nonviable Pregnancy Early in the
First Trimester. N Engl J Med 4384;[DATE].
